# Patient Record
Sex: Female | Born: 1995 | Race: White | Hispanic: No | Marital: Married | Smoking: Never smoker
Health system: Southern US, Community
[De-identification: ages and names within clinical notes are randomized; demographics above are authoritative.]

## PROBLEM LIST (undated history)

## (undated) DIAGNOSIS — D649 Anemia, unspecified: Secondary | ICD-10-CM

## (undated) DIAGNOSIS — J45909 Unspecified asthma, uncomplicated: Secondary | ICD-10-CM

## (undated) DIAGNOSIS — C4491 Basal cell carcinoma of skin, unspecified: Secondary | ICD-10-CM

## (undated) HISTORY — PX: BILATERAL SALPINGECTOMY: SHX5743

## (undated) HISTORY — PX: WISDOM TOOTH EXTRACTION: SHX21

## (undated) HISTORY — DX: Basal cell carcinoma of skin, unspecified: C44.91

---
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
## 2018-01-14 ENCOUNTER — Emergency Department
Admission: EM | Admit: 2018-01-14 | Discharge: 2018-01-14 | Disposition: A | Discharge status: Home / Self Care | Payer: Commercial Managed Care - HMO | Attending: Student in an Organized Health Care Education/Training Program | Admitting: Student in an Organized Health Care Education/Training Program

## 2018-01-14 ENCOUNTER — Emergency Department: Payer: Commercial Managed Care - HMO

## 2018-01-14 ENCOUNTER — Encounter: Payer: Self-pay | Admitting: Emergency Medicine

## 2018-01-14 DIAGNOSIS — G510 Bell's palsy: Secondary | ICD-10-CM | POA: Insufficient documentation

## 2018-01-14 LAB — CBC WITH DIFFERENTIAL/PLATELET
Basophils Absolute: 0 10*3/uL (ref 0–0.1)
Basophils Relative: 0 %
EOS ABS: 0.1 10*3/uL (ref 0–0.7)
EOS PCT: 1 %
HCT: 38.3 % (ref 35.0–47.0)
Hemoglobin: 12.7 g/dL (ref 12.0–16.0)
Lymphocytes Relative: 31 %
Lymphs Abs: 2.6 10*3/uL (ref 1.0–3.6)
MCH: 28 pg (ref 26.0–34.0)
MCHC: 33.3 g/dL (ref 32.0–36.0)
MCV: 84.2 fL (ref 80.0–100.0)
MONO ABS: 0.6 10*3/uL (ref 0.2–0.9)
MONOS PCT: 7 %
Neutro Abs: 5.2 10*3/uL (ref 1.4–6.5)
Neutrophils Relative %: 61 %
PLATELETS: 307 10*3/uL (ref 150–440)
RBC: 4.55 MIL/uL (ref 3.80–5.20)
RDW: 12.5 % (ref 11.5–14.5)
WBC: 8.6 10*3/uL (ref 3.6–11.0)

## 2018-01-14 LAB — BASIC METABOLIC PANEL
Anion gap: 7 (ref 5–15)
BUN: 14 mg/dL (ref 6–20)
CALCIUM: 9 mg/dL (ref 8.9–10.3)
CO2: 26 mmol/L (ref 22–32)
CREATININE: 0.65 mg/dL (ref 0.44–1.00)
Chloride: 105 mmol/L (ref 101–111)
GFR calc Af Amer: >60 mL/min (ref >60)
Glucose, Bld: 81 mg/dL (ref 65–99)
Potassium: 3.6 mmol/L (ref 3.5–5.1)
SODIUM: 138 mmol/L (ref 135–145)

## 2018-01-14 LAB — POCT PREGNANCY, URINE: Preg Test, Ur: NEGATIVE

## 2018-01-14 MED ORDER — IOHEXOL 350 MG/ML SOLN
75.0000 mL | Freq: Once | INTRAVENOUS | Status: AC | PRN
  Administered 2018-01-14: 75 mL via INTRAVENOUS

## 2018-01-14 MED ORDER — VALACYCLOVIR HCL 500 MG PO TABS: 1000.0000 mg | ORAL_TABLET | Freq: Three times a day (TID) | ORAL | 0 refills | Status: AC

## 2018-01-14 MED ORDER — PREDNISONE 20 MG PO TABS
60.0000 mg | ORAL_TABLET | Freq: Once | ORAL | Status: AC
  Administered 2018-01-14: 60 mg via ORAL
  Filled 2018-01-14: qty 6

## 2018-01-14 MED ORDER — POLYMYXIN B-TRIMETHOPRIM 10000-0.1 UNIT/ML-% OP SOLN: 1.0000 [drp] | OPHTHALMIC | 0 refills | Status: DC

## 2018-01-14 MED ORDER — PREDNISONE 10 MG PO TABS: 10.0000 mg | ORAL_TABLET | Freq: Every day | ORAL | 0 refills | Status: DC

## 2018-01-14 NOTE — ED Notes (Signed)
Patient transported to CT 

## 2018-01-14 NOTE — Discharge Instructions (Signed)
Sure to keep right eye covered while sleeping to prevent abrasion or injury.  Follow-up with neurology.  Return to the ER should you develop any worsening symptoms or for any additional questions or concerns.

## 2018-01-14 NOTE — ED Triage Notes (Signed)
Patient presents to the ED with right facial and neck numbness since 6pm 2 days ago.  Patient's facial movements are uneven.  Patient cannot shut right eye as tightly as her left eye.  Patient denies any weakness in arm or leg.  Patient is having no difficulty speaking.  Alert and oriented x 4.

## 2018-01-14 NOTE — ED Provider Notes (Signed)
[facility] Emergency Department Provider Note    First MD Initiated Contact with Patient 01/14/18 1119     (approximate)  I have reviewed the triage vital signs and the nursing notes.   HISTORY  Chief Complaint Numbness (right side)    HPI Madison Morrow is a 22 y.o. female present to the ER with chief complaint of right sided facial tingling as well as weakness started roughly 1/2 days ago.  Says she is also having some right-sided neck pain.  States that she has numbness and tingling going down into her right neck and right shoulder.  States that she noticed this morning when she was switching mouthwash that she is having trouble keeping the water in her mouth.  Denies any other associated numbness or tingling.  Does have family history of stroke.  Has a family history of hypertension but she has no significant past medical history.  Denies any chance of being pregnant.    History reviewed. No pertinent past medical history. No family history on file. Past Surgical History:  Procedure Laterality Date  . BILATERAL SALPINGECTOMY     There are no active problems to display for this patient.     Prior to Admission medications   Medication Sig Start Date End Date Taking? Authorizing Provider  predniSONE (DELTASONE) 10 MG tablet Take 1 tablet (10 mg total) by mouth daily. Day 1-2: Take 50 mg  ( 5 pills) Day 3-4 : Take 40 mg (4pills) Day 5-6: Take 30 mg (3 pills) Day 7-8:  Take 20 mg (2 pills) Day 9:  Take 10mg  (1 pill) 01/14/18   Merlyn Lot, MD  trimethoprim-polymyxin b (POLYTRIM) ophthalmic solution Place 1 drop into the right eye every 4 (four) hours. 01/14/18   Merlyn Lot, MD  valACYclovir (VALTREX) 500 MG tablet Take 2 tablets (1,000 mg total) by mouth 3 (three) times daily for 7 days. 01/14/18 01/21/18  Merlyn Lot, MD    Allergies Amoxicillin and Penicillins    Social History Social History   Tobacco Use  . Smoking  status: Never Smoker  . Smokeless tobacco: Never Used  Substance Use Topics  . Alcohol use: Never    Frequency: Never  . Drug use: Not on file    Review of Systems Patient denies headaches, rhinorrhea, blurry vision, numbness, shortness of breath, chest pain, edema, cough, abdominal pain, nausea, vomiting, diarrhea, dysuria, fevers, rashes or hallucinations unless otherwise stated above in HPI. ____________________________________________   PHYSICAL EXAM:  VITAL SIGNS: Vitals:   01/14/18 1730 01/14/18 1800  BP: 129/84 117/75  Pulse: 82 (!) 56  Resp:  14  Temp:    SpO2: 100% (!) 82%    Constitutional: Alert and oriented.  Eyes: Conjunctivae are normal.  Head: Atraumatic. Nose: No congestion/rhinnorhea. Mouth/Throat: Mucous membranes are moist.   Neck: No stridor. Painless ROM.  Cardiovascular: Normal rate, regular rhythm. Grossly normal heart sounds.  Good peripheral circulation. Respiratory: Normal respiratory effort.  No retractions. Lungs CTAB. Gastrointestinal: Soft and nontender. No distention. No abdominal bruits. No CVA tenderness. Genitourinary:  Musculoskeletal: No lower extremity tenderness nor edema.  No joint effusions. Neurologic:  Normal speech and language.+ right facial droop.  Decreased sensation of right face.  Forehead is preserved on right.  EOMI.  SILT to BUE and BLE.  5/5 motor in BUE and BLE.  Normal FNF Skin:  Skin is warm, dry and intact. No rash noted. Psychiatric: Mood and affect are normal. Speech and behavior are normal.  ____________________________________________  LABS (all labs ordered are listed, but only abnormal results are displayed)  Results for orders placed or performed during the hospital encounter of 01/14/18 (from the past 24 hour(s))  CBC with Differential/Platelet     Status: None   Collection Time: 01/14/18 11:43 AM  Result Value Ref Range   WBC 8.6 3.6 - 11.0 K/uL   RBC 4.55 3.80 - 5.20 MIL/uL   Hemoglobin 12.7 12.0 -  16.0 g/dL   HCT 38.3 35.0 - 47.0 %   MCV 84.2 80.0 - 100.0 fL   MCH 28.0 26.0 - 34.0 pg   MCHC 33.3 32.0 - 36.0 g/dL   RDW 12.5 11.5 - 14.5 %   Platelets 307 150 - 440 K/uL   Neutrophils Relative % 61 %   Neutro Abs 5.2 1.4 - 6.5 K/uL   Lymphocytes Relative 31 %   Lymphs Abs 2.6 1.0 - 3.6 K/uL   Monocytes Relative 7 %   Monocytes Absolute 0.6 0.2 - 0.9 K/uL   Eosinophils Relative 1 %   Eosinophils Absolute 0.1 0 - 0.7 K/uL   Basophils Relative 0 %   Basophils Absolute 0.0 0 - 0.1 K/uL  Basic metabolic panel     Status: None   Collection Time: 01/14/18 11:43 AM  Result Value Ref Range   Sodium 138 135 - 145 mmol/L   Potassium 3.6 3.5 - 5.1 mmol/L   Chloride 105 101 - 111 mmol/L   CO2 26 22 - 32 mmol/L   Glucose, Bld 81 65 - 99 mg/dL   BUN 14 6 - 20 mg/dL   Creatinine, Ser 0.65 0.44 - 1.00 mg/dL   Calcium 9.0 8.9 - 10.3 mg/dL   GFR calc non Af Amer >60 >60 mL/min   GFR calc Af Amer >60 >60 mL/min   Anion gap 7 5 - 15  Pregnancy, urine POC     Status: None   Collection Time: 01/14/18 12:45 PM  Result Value Ref Range   Preg Test, Ur NEGATIVE NEGATIVE   ____________________________________________  EKG My review and personal interpretation at Time: 11:48   Indication: weakness  Rate: 70  Rhythm:   Axis: sinus Other: normal intervals, no stemi ____________________________________________  RADIOLOGY  I personally reviewed all radiographic images ordered to evaluate for the above acute complaints and reviewed radiology reports and findings.  These findings were personally discussed with the patient.  Please see medical record for radiology report.  ____________________________________________   PROCEDURES  Procedure(s) performed:  Procedures    Critical Care performed: o ____________________________________________   INITIAL IMPRESSION / ASSESSMENT AND PLAN / ED COURSE  Pertinent labs & imaging results that were available during my care of the patient were  reviewed by me and considered in my medical decision making (see chart for details).   DDX: bells palsy, vertebral dissection cva, mass, neuropathy, lymes, pregnancy  Madison Morrow is a 22 y.o. who presents to the ED with symptoms as described above.  Symptoms started over a day and a half ago seem to be steadily progressing.  Outside of the window for stroke.  Presentation most likely partial Bell's palsy given her age And lack of risk factors for CVA but given her neck pain will also order CTA to exclude dissection.  Blood work sent for the above differential.  Patient otherwise well-appearing and in no acute distress.  Clinical Course as of Jan 15 2032  Sun Jan 14, 2018  1438 Discussed abnormality of CTA with patient of recommended MRI for further  evaluation.  Patient agrees to   [PR]    Clinical Course User Index [PR] Merlyn Lot, MD   MRI shows no evidence of acute ischemia.  I discussed case with Dr. Irish Elders of neurology.  Based on the abnormal finding on CTA will start on prednisone as well as send off inflammatory markers.  Vasculitis remains on the differential but patient otherwise with minimal symptoms at this time which could be consistent with Bell's.  Patient will be given referral to outpatient neurology.  Patient demonstrates understanding is agreeable to plan.  As part of my medical decision making, I reviewed the following data within the [place] notes reviewed and incorporated, Labs reviewed, notes from prior ED visits.  ____________________________________________   FINAL CLINICAL IMPRESSION(S) / ED DIAGNOSES  Final diagnoses:  Bell's palsy      NEW MEDICATIONS STARTED DURING THIS VISIT:  Discharge Medication List as of 01/14/2018  5:48 PM    START taking these medications   Details  predniSONE (DELTASONE) 10 MG tablet Take 1 tablet (10 mg total) by mouth daily. Day 1-2: Take 50 mg  ( 5 pills) Day 3-4 : Take 40 mg  (4pills) Day 5-6: Take 30 mg (3 pills) Day 7-8:  Take 20 mg (2 pills) Day 9:  Take 10mg  (1 pill), Starting Sun 01/14/2018, Print    trimethoprim-polymyxin b (POLYTRIM) ophthalmic solution Place 1 drop into the right eye every 4 (four) hours., Starting Sun 01/14/2018, Print    valACYclovir (VALTREX) 500 MG tablet Take 2 tablets (1,000 mg total) by mouth 3 (three) times daily for 7 days., Starting Sun 01/14/2018, Until Sun 01/21/2018, Print         Note:  This document was prepared using Dragon voice recognition software and may include unintentional dictation errors.    Merlyn Lot, MD 01/14/18 2034

## 2018-01-14 NOTE — ED Notes (Signed)
Pt transported to MRI 

## 2018-01-14 NOTE — ED Notes (Signed)
First Nurse Note: Pt to ED via POV c/o right sided facial numbness x 2 days. No facial droop noted. No slurred speech.

## 2018-01-15 LAB — C-REACTIVE PROTEIN

## 2019-09-25 DIAGNOSIS — D239 Other benign neoplasm of skin, unspecified: Secondary | ICD-10-CM

## 2019-09-25 HISTORY — DX: Other benign neoplasm of skin, unspecified: D23.9

## 2019-09-30 NOTE — H&P (Signed)
Chief Complaint:   Madison Morrow is a 24 y.o. female presenting with Pre Op Consulting (sign consents)   History of Present Illness: Patient is an established patient who returns for a pre-operative visit. She will undergo a Total Laparoscopic Hysterectomy. Indications are her history of endometriosis, pelvic pain, and menorrhagia. She has failed OTC medications, she declines any hormonal treatment, and desires something permanent. She is s/p BS for sterilization and endometriosis pain. She is fully aware that without a uterus she will be unable to gestate a pregnancy, and that is not something she ever wants to do.  - She does not want an open incision if she can help it. She brought records for review today. Stage 1 endometriosis noted 4 yrs ago in the anterior and posterior cul de sac. If extensive, will abort procedure and send to [facility]. That offer given today as well.  Workup: Pap: 09/2018 neg/neg TVUS 12/2018: Uterus anteverted No IUD seen Complex thickened endometrium=13.54mm Fibroid seen: posterior=3cm Small amt of free fluid seen in PCDS Lt ovary appears wnl Rt complex ovarian cyst=2.1cm; septation=0.16cm   Pertinent Surgical Hx: -Bilateral salpingectomy, pt reports surgeon told her he found endometriosis in the back of the uterus and on the colon  Past Medical History:  has a past medical history of Bell's palsy, Endometriosis, and Seasonal asthma.  Past Surgical History:  has a past surgical history that includes Laparoscopic Salpingectomy (Bilateral, 2017). Family History: family history includes High blood pressure (Hypertension) in her father; Thyroid disease in her mother. Social History:  reports that she has never smoked. She has never used smokeless tobacco. She reports previous alcohol use. She reports that she does not use drugs. OB/GYN History:  OB History    Gravida  0   Para  0   Term  0   Preterm  0   AB  0   Living  0     SAB  0   TAB  0   Ectopic  0   Molar  0   Multiple  0   Live Births  0        Allergies: is allergic to amoxicillin and penicillin. Medications: Current Outpatient Medications:  .  albuterol 90 mcg/actuation inhaler, Inhale 2 inhalations into the lungs every 6 (six) hours as needed for Wheezing, Disp: 1 Inhaler, Rfl: 0 .  ferrous sulfate 325 (65 FE) MG tablet, Take 325 mg by mouth daily with breakfast, Disp: , Rfl:  .  meloxicam (MOBIC) 15 MG tablet, Take 1 tablet (15 mg total) by mouth once daily as needed for Pain For menstrual pain (Patient not taking: Reported on 09/17/2019  ), Disp: 30 tablet, Rfl: 0   Exam:   BP (!) 132/90   Ht 167.6 cm (5\' 6" )   Wt 81 kg (178 lb 9.6 oz)   LMP 09/13/2019 (Exact Date)   BMI 28.83 kg/m   General: Patient is well-groomed, well-nourished, appears stated age in no acute distress   HEENT: head is atraumatic and normocephalic, trachea is midline, neck is supple with no palpable nodules   CV: Regular rhythm and normal heart rate, no murmur   Pulm: Clear to auscultation throughout lung fields with no wheezing, crackles, or rhonchi. No increased work of breathing  Abdomen: soft , no mass, non-tender, no rebound tenderness, no hepatomegaly  Pelvic: deferred  Impression:   The primary encounter diagnosis was Pre-operative clearance. A diagnosis of History of endometriosis was also pertinent to this visit.  Plan:  1. Pre-Operative Visit,  Patient returns for a preoperative discussion regarding her plans to proceed with surgical treatment of her endometriosis, pelvic pain, and menorrhagia by total laparoscopic hysterectomy procedure. We will perform a cystoscopy to evaluate the urinary tract after the procedure.   The patient and I discussed the technical aspects of the procedure including the potential for risks and complications.  These include but are not limited to the risk of infection requiring post-operative antibiotics or further procedures.  We talked  about the risk of injury to adjacent organs including bladder, bowel, ureter, blood vessels or nerves.  We talked about the need to convert to an open incision.  We talked about the possible need for blood transfusion.  We talked about postop complications such as thromboembolic or cardiopulmonary complications.  All of her questions were answered.  Her preoperative exam was completed and the appropriate consents were signed. She is scheduled to undergo this procedure in the near future.  Specific Peri-operative Considerations:  - Consent: obtained today - Health Maintenance: None - Labs: CBC, CMP preoperatively - Studies: EKG, CXR preoperatively - Bowel Preparation: None required - Abx:  Gent/Clinda with PCN allergy - VTE ppx: SCDs perioperatively - Glucose Protocol: None - Beta-blockade: None  Patient is concerned about a large scar. I will plan for a Total Laparoscopic Hysterectomy but I have made patient aware that surgery can be unpredictable and risks to a TLH include possible conversion to an Open Abdominal Hysterectomy with a larger scar. She has requested that if for any reason there is a contraindication for laparoscopic approach, I stop surgery and we return to the option of an ablation even if it is not the recommended option. I have also offered to refer her to [place] at this time, and she would accept that option if unable to complete this surgery.  Diagnoses and all orders for this visit:  Pre-operative clearance  History of endometriosis   Return for Postop check.  ~~~~~~~~~~~~~~~~~~~~~~~~~~~~~~~~~~~~~~~~~~~~~~~~~~~~~~~~~~~~ This note is partially written by Priscella Mann, in the presence of and acting as the scribe of Dr. Benjaman Kindler, who has reviewed, edited and added to the note to reflect her best personal medical judgment.  This note was generated in part with voice recognition software and I apologize for any typographical errors that were not detected and  corrected.  Sherrie George, MD

## 2019-10-04 ENCOUNTER — Encounter: Payer: Self-pay | Admitting: *Deleted

## 2019-10-04 ENCOUNTER — Encounter
Admission: RE | Admit: 2019-10-04 | Discharge: 2019-10-04 | Disposition: A | Discharge status: Home / Self Care | Payer: Commercial Managed Care - PPO | Source: Ambulatory Visit | Attending: Obstetrics and Gynecology | Admitting: Obstetrics and Gynecology

## 2019-10-04 ENCOUNTER — Other Ambulatory Visit: Payer: Self-pay

## 2019-10-04 HISTORY — DX: Anemia, unspecified: D64.9

## 2019-10-04 HISTORY — DX: Unspecified asthma, uncomplicated: J45.909

## 2019-10-04 NOTE — Patient Instructions (Signed)
Your procedure is scheduled on: Mon 3/22 Report to Day Surgery. To find out your arrival time please call [phone] between 1PM - 3PM on Friday 3/19.  Remember: Instructions that are not followed completely may result in serious medical risk,  up to and including death, or upon the discretion of your surgeon and anesthesiologist your  surgery may need to be rescheduled.     _X__ 1. Do not eat food after midnight the night before your procedure.                 No gum chewing or hard candies. You may drink clear liquids up to 2 hours                 before you are scheduled to arrive for your surgery- DO not drink clear                 liquids within 2 hours of the start of your surgery.                 Clear Liquids include:  water, apple juice without pulp, clear Gatorade, G2 or                  Gatorade Zero (avoid Red/Purple/Blue), Black Coffee or Tea (Do not add                 anything to coffee or tea). __x___2.   Complete the carbohydrate drink provided to you, 2 hours before arrival.  __X__2.  On the morning of surgery brush your teeth with toothpaste and water, you                may rinse your mouth with mouthwash if you wish.  Do not swallow any toothpaste of mouthwash.     ___ 3.  No Alcohol for 24 hours before or after surgery.   ___ 4.  Do Not Smoke or use e-cigarettes For 24 Hours Prior to Your Surgery.                 Do not use any chewable tobacco products for at least 6 hours prior to                 surgery.  ____  5.  Bring all medications with you on the day of surgery if instructed.   _x___  6.  Notify your doctor if there is any change in your medical condition      (cold, fever, infections).     Do not wear jewelry, make-up, hairpins, clips or nail polish. Do not wear lotions, powders, or perfumes. You may wear deodorant. Do not shave 48 hours prior to surgery. Men may shave face and neck. Do not bring valuables to the hospital.     [facility] is not responsible for any belongings or valuables.  Contacts, dentures or bridgework may not be worn into surgery. Leave your suitcase in the car. After surgery it may be brought to your room. For patients admitted to the hospital, discharge time is determined by your treatment team.   Patients discharged the day of surgery will not be allowed to drive home.   Make arrangements for someone to be with you for the first 24 hours of your Same Day Discharge.    Please read over the following fact sheets that you were given:    _x___ Take these medicines the morning of surgery with A SIP OF WATER:  1. none  2.   3.   4.  5.  6.  ____ Fleet Enema (as directed)   __x__ Use CHG Soap (or wipes) as directed  ____ Use Benzoyl Peroxide Gel as instructed  __x__ Use inhalers on the day of surgery albuterol (VENTOLIN HFA) 108 (90 Base) MCG/ACT inhaler  And bring with yout  ____ Stop metformin 2 days prior to surgery    ____ Take 1/2 of usual insulin dose the night before surgery. No insulin the morning          of surgery.   ____ Stop Coumadin/Plavix/aspirin    ____ Stop Anti-inflammatories    __x__ Stop supplements until after surgery.  ascorbic acid (VITAMIN C) 500 MG tablet  ____ Bring C-Pap to the hospital.

## 2019-10-07 ENCOUNTER — Other Ambulatory Visit: Payer: Self-pay

## 2019-10-09 ENCOUNTER — Encounter: Admission: RE | Admit: 2019-10-09 | Payer: Commercial Managed Care - PPO | Source: Ambulatory Visit

## 2019-10-10 ENCOUNTER — Other Ambulatory Visit
Admission: RE | Admit: 2019-10-10 | Discharge: 2019-10-10 | Disposition: A | Discharge status: Home / Self Care | Payer: Commercial Managed Care - PPO | Source: Ambulatory Visit | Attending: Obstetrics and Gynecology | Admitting: Obstetrics and Gynecology

## 2019-10-10 ENCOUNTER — Other Ambulatory Visit: Payer: Self-pay

## 2019-10-10 DIAGNOSIS — Z01812 Encounter for preprocedural laboratory examination: Secondary | ICD-10-CM | POA: Diagnosis not present

## 2019-10-10 DIAGNOSIS — Z20822 Contact with and (suspected) exposure to covid-19: Secondary | ICD-10-CM | POA: Diagnosis not present

## 2019-10-10 LAB — SARS CORONAVIRUS 2 (TAT 6-24 HRS): SARS Coronavirus 2: NEGATIVE

## 2019-10-10 LAB — TYPE AND SCREEN
ABO/RH(D): O POS
Antibody Screen: NEGATIVE

## 2019-10-14 ENCOUNTER — Encounter: Payer: Self-pay | Admitting: Obstetrics and Gynecology

## 2019-10-14 ENCOUNTER — Ambulatory Visit: Payer: Commercial Managed Care - PPO | Admitting: Registered Nurse

## 2019-10-14 ENCOUNTER — Encounter
Admission: RE | Disposition: A | Discharge status: Home / Self Care | Payer: Self-pay | Source: Home / Self Care | Attending: Obstetrics and Gynecology

## 2019-10-14 ENCOUNTER — Other Ambulatory Visit: Payer: Self-pay

## 2019-10-14 ENCOUNTER — Ambulatory Visit
Admission: RE | Admit: 2019-10-14 | Discharge: 2019-10-14 | Disposition: A | Discharge status: Home / Self Care | Payer: Commercial Managed Care - PPO | Attending: Obstetrics and Gynecology | Admitting: Obstetrics and Gynecology

## 2019-10-14 DIAGNOSIS — D259 Leiomyoma of uterus, unspecified: Secondary | ICD-10-CM | POA: Diagnosis not present

## 2019-10-14 DIAGNOSIS — R102 Pelvic and perineal pain: Secondary | ICD-10-CM | POA: Diagnosis present

## 2019-10-14 DIAGNOSIS — Z880 Allergy status to penicillin: Secondary | ICD-10-CM | POA: Insufficient documentation

## 2019-10-14 DIAGNOSIS — N870 Mild cervical dysplasia: Secondary | ICD-10-CM | POA: Insufficient documentation

## 2019-10-14 DIAGNOSIS — N803 Endometriosis of pelvic peritoneum: Secondary | ICD-10-CM | POA: Diagnosis not present

## 2019-10-14 DIAGNOSIS — Z79899 Other long term (current) drug therapy: Secondary | ICD-10-CM | POA: Insufficient documentation

## 2019-10-14 HISTORY — PX: LAPAROSCOPIC HYSTERECTOMY: SHX1926

## 2019-10-14 LAB — CBC WITH DIFFERENTIAL/PLATELET
Abs Immature Granulocytes: 0.04 10*3/uL (ref 0.00–0.07)
Basophils Absolute: 0 10*3/uL (ref 0.0–0.1)
Basophils Relative: 1 %
Eosinophils Absolute: 0.1 10*3/uL (ref 0.0–0.5)
Eosinophils Relative: 1 %
HCT: 41.1 % (ref 36.0–46.0)
Hemoglobin: 13.2 g/dL (ref 12.0–15.0)
Immature Granulocytes: 1 %
Lymphocytes Relative: 26 %
Lymphs Abs: 2.1 10*3/uL (ref 0.7–4.0)
MCH: 27.6 pg (ref 26.0–34.0)
MCHC: 32.1 g/dL (ref 30.0–36.0)
MCV: 85.8 fL (ref 80.0–100.0)
Monocytes Absolute: 0.5 10*3/uL (ref 0.1–1.0)
Monocytes Relative: 7 %
Neutro Abs: 5.2 10*3/uL (ref 1.7–7.7)
Neutrophils Relative %: 64 %
Platelets: 310 10*3/uL (ref 150–400)
RBC: 4.79 MIL/uL (ref 3.87–5.11)
RDW: 12.2 % (ref 11.5–15.5)
WBC: 7.9 10*3/uL (ref 4.0–10.5)
nRBC: 0 % (ref 0.0–0.2)

## 2019-10-14 LAB — COMPREHENSIVE METABOLIC PANEL
ALT: 12 U/L (ref 0–44)
AST: 17 U/L (ref 15–41)
Albumin: 4.6 g/dL (ref 3.5–5.0)
Alkaline Phosphatase: 62 U/L (ref 38–126)
Anion gap: 9 (ref 5–15)
BUN: 11 mg/dL (ref 6–20)
CO2: 23 mmol/L (ref 22–32)
Calcium: 9.2 mg/dL (ref 8.9–10.3)
Chloride: 107 mmol/L (ref 98–111)
Creatinine, Ser: 0.64 mg/dL (ref 0.44–1.00)
GFR calc Af Amer: >60 mL/min (ref >60)
GFR calc non Af Amer: >60 mL/min (ref >60)
Glucose, Bld: 73 mg/dL (ref 70–99)
Potassium: 3.8 mmol/L (ref 3.5–5.1)
Sodium: 139 mmol/L (ref 135–145)
Total Bilirubin: 0.6 mg/dL (ref 0.3–1.2)
Total Protein: 7.9 g/dL (ref 6.5–8.1)

## 2019-10-14 LAB — ABO/RH: ABO/RH(D): O POS

## 2019-10-14 LAB — POCT PREGNANCY, URINE: Preg Test, Ur: NEGATIVE

## 2019-10-14 SURGERY — HYSTERECTOMY, TOTAL, LAPAROSCOPIC
Anesthesia: General

## 2019-10-14 MED ORDER — SODIUM CHLORIDE FLUSH 0.9 % IV SOLN
INTRAVENOUS | Status: AC
  Filled 2019-10-14: qty 10

## 2019-10-14 MED ORDER — ACETAMINOPHEN 500 MG PO TABS: 1000.0000 mg | ORAL_TABLET | ORAL | Status: AC

## 2019-10-14 MED ORDER — FAMOTIDINE 20 MG PO TABS: 20.0000 mg | ORAL_TABLET | Freq: Once | ORAL | Status: AC

## 2019-10-14 MED ORDER — ONDANSETRON HCL 4 MG/2ML IJ SOLN
INTRAMUSCULAR | Status: DC | PRN
  Administered 2019-10-14: 4 mg via INTRAVENOUS

## 2019-10-14 MED ORDER — BUPIVACAINE HCL 0.5 % IJ SOLN
INTRAMUSCULAR | Status: DC | PRN
  Administered 2019-10-14: 12 mL

## 2019-10-14 MED ORDER — MEPERIDINE HCL 50 MG/ML IJ SOLN
6.2500 mg | Freq: Once | INTRAMUSCULAR | Status: AC
  Administered 2019-10-14: 6.25 mg via INTRAVENOUS

## 2019-10-14 MED ORDER — IBUPROFEN 800 MG PO TABS: 800.0000 mg | ORAL_TABLET | Freq: Three times a day (TID) | ORAL | 1 refills | Status: AC

## 2019-10-14 MED ORDER — CLINDAMYCIN PHOSPHATE 900 MG/50ML IV SOLN
900.0000 mg | INTRAVENOUS | Status: AC
  Administered 2019-10-14: 13:00:00 900 mg via INTRAVENOUS

## 2019-10-14 MED ORDER — PROPOFOL 10 MG/ML IV BOLUS
INTRAVENOUS | Status: AC
  Filled 2019-10-14: qty 20

## 2019-10-14 MED ORDER — SENNA 8.6 MG PO TABS: 1.0000 | ORAL_TABLET | Freq: Every day | ORAL | 0 refills | Status: AC | PRN

## 2019-10-14 MED ORDER — MIDAZOLAM HCL 2 MG/2ML IJ SOLN
INTRAMUSCULAR | Status: AC
  Filled 2019-10-14: qty 2

## 2019-10-14 MED ORDER — LIDOCAINE HCL (CARDIAC) PF 100 MG/5ML IV SOSY
PREFILLED_SYRINGE | INTRAVENOUS | Status: DC | PRN
  Administered 2019-10-14: 60 mg via INTRAVENOUS
  Administered 2019-10-14: 40 mg via INTRAVENOUS

## 2019-10-14 MED ORDER — EPHEDRINE 5 MG/ML INJ
INTRAVENOUS | Status: AC
  Filled 2019-10-14: qty 10

## 2019-10-14 MED ORDER — FENTANYL CITRATE (PF) 100 MCG/2ML IJ SOLN
25.0000 ug | INTRAMUSCULAR | Status: DC | PRN
  Administered 2019-10-14 (×3): 25 ug via INTRAVENOUS

## 2019-10-14 MED ORDER — LIDOCAINE HCL (PF) 2 % IJ SOLN
INTRAMUSCULAR | Status: AC
  Filled 2019-10-14: qty 5

## 2019-10-14 MED ORDER — GABAPENTIN 300 MG PO CAPS: 300.0000 mg | ORAL_CAPSULE | ORAL | Status: AC

## 2019-10-14 MED ORDER — ACETAMINOPHEN 500 MG PO TABS
ORAL_TABLET | ORAL | Status: AC
  Administered 2019-10-14: 1000 mg via ORAL
  Filled 2019-10-14: qty 2

## 2019-10-14 MED ORDER — ONDANSETRON HCL 4 MG/2ML IJ SOLN: 4.0000 mg | Freq: Once | INTRAMUSCULAR | Status: DC | PRN

## 2019-10-14 MED ORDER — GENTAMICIN SULFATE 40 MG/ML IJ SOLN
5.0000 mg/kg | INTRAVENOUS | Status: AC
  Administered 2019-10-14: 400 mg via INTRAVENOUS
  Filled 2019-10-14: qty 10

## 2019-10-14 MED ORDER — DEXAMETHASONE SODIUM PHOSPHATE 10 MG/ML IJ SOLN
INTRAMUSCULAR | Status: AC
  Filled 2019-10-14: qty 1

## 2019-10-14 MED ORDER — DEXAMETHASONE SODIUM PHOSPHATE 10 MG/ML IJ SOLN
INTRAMUSCULAR | Status: DC | PRN
  Administered 2019-10-14: 8 mg via INTRAVENOUS

## 2019-10-14 MED ORDER — IBUPROFEN 800 MG PO TABS
ORAL_TABLET | ORAL | Status: AC
  Administered 2019-10-14: 800 mg
  Filled 2019-10-14: qty 1

## 2019-10-14 MED ORDER — ONDANSETRON HCL 4 MG/2ML IJ SOLN
INTRAMUSCULAR | Status: AC
  Filled 2019-10-14: qty 2

## 2019-10-14 MED ORDER — OXYCODONE HCL 5 MG PO TABS
ORAL_TABLET | ORAL | Status: AC
  Administered 2019-10-14: 5 mg via ORAL
  Filled 2019-10-14: qty 1

## 2019-10-14 MED ORDER — DEXMEDETOMIDINE HCL 200 MCG/2ML IV SOLN
INTRAVENOUS | Status: DC | PRN
  Administered 2019-10-14 (×3): 8 ug via INTRAVENOUS

## 2019-10-14 MED ORDER — ROCURONIUM BROMIDE 100 MG/10ML IV SOLN
INTRAVENOUS | Status: DC | PRN
  Administered 2019-10-14: 10 mg via INTRAVENOUS
  Administered 2019-10-14: 50 mg via INTRAVENOUS

## 2019-10-14 MED ORDER — PROPOFOL 10 MG/ML IV BOLUS
INTRAVENOUS | Status: DC | PRN
  Administered 2019-10-14: 160 mg via INTRAVENOUS
  Administered 2019-10-14: 20 mg via INTRAVENOUS

## 2019-10-14 MED ORDER — DEXMEDETOMIDINE HCL IN NACL 80 MCG/20ML IV SOLN
INTRAVENOUS | Status: AC
  Filled 2019-10-14: qty 20

## 2019-10-14 MED ORDER — GABAPENTIN 300 MG PO CAPS
ORAL_CAPSULE | ORAL | Status: AC
  Administered 2019-10-14: 300 mg via ORAL
  Filled 2019-10-14: qty 1

## 2019-10-14 MED ORDER — CELECOXIB 200 MG PO CAPS
ORAL_CAPSULE | ORAL | Status: AC
  Administered 2019-10-14: 400 mg via ORAL
  Filled 2019-10-14: qty 2

## 2019-10-14 MED ORDER — FENTANYL CITRATE (PF) 100 MCG/2ML IJ SOLN
INTRAMUSCULAR | Status: DC | PRN
  Administered 2019-10-14: 25 ug via INTRAVENOUS
  Administered 2019-10-14: 75 ug via INTRAVENOUS

## 2019-10-14 MED ORDER — MEPERIDINE HCL 50 MG/ML IJ SOLN
INTRAMUSCULAR | Status: AC
  Filled 2019-10-14: qty 1

## 2019-10-14 MED ORDER — KETAMINE HCL 10 MG/ML IJ SOLN
INTRAMUSCULAR | Status: DC | PRN
  Administered 2019-10-14: 10 mg via INTRAVENOUS
  Administered 2019-10-14: 30 mg via INTRAVENOUS

## 2019-10-14 MED ORDER — SUGAMMADEX SODIUM 200 MG/2ML IV SOLN
INTRAVENOUS | Status: DC | PRN
  Administered 2019-10-14: 200 mg via INTRAVENOUS

## 2019-10-14 MED ORDER — FENTANYL CITRATE (PF) 100 MCG/2ML IJ SOLN
INTRAMUSCULAR | Status: AC
  Filled 2019-10-14: qty 2

## 2019-10-14 MED ORDER — CELECOXIB 200 MG PO CAPS: 400.0000 mg | ORAL_CAPSULE | ORAL | Status: AC

## 2019-10-14 MED ORDER — FENTANYL CITRATE (PF) 100 MCG/2ML IJ SOLN
INTRAMUSCULAR | Status: AC
  Administered 2019-10-14: 25 ug via INTRAVENOUS
  Filled 2019-10-14: qty 2

## 2019-10-14 MED ORDER — FAMOTIDINE 20 MG PO TABS
ORAL_TABLET | ORAL | Status: AC
  Administered 2019-10-14: 20 mg via ORAL
  Filled 2019-10-14: qty 1

## 2019-10-14 MED ORDER — DOCUSATE SODIUM 100 MG PO CAPS: 100.0000 mg | ORAL_CAPSULE | Freq: Two times a day (BID) | ORAL | 0 refills | Status: AC

## 2019-10-14 MED ORDER — IBUPROFEN 800 MG PO TABS: 800.0000 mg | ORAL_TABLET | Freq: Three times a day (TID) | ORAL | Status: DC

## 2019-10-14 MED ORDER — CLINDAMYCIN PHOSPHATE 900 MG/50ML IV SOLN
INTRAVENOUS | Status: AC
  Filled 2019-10-14: qty 50

## 2019-10-14 MED ORDER — MIDAZOLAM HCL 2 MG/2ML IJ SOLN
INTRAMUSCULAR | Status: DC | PRN
  Administered 2019-10-14: 1 mg via INTRAVENOUS
  Administered 2019-10-14: 2 mg via INTRAVENOUS

## 2019-10-14 MED ORDER — BUPIVACAINE HCL (PF) 0.5 % IJ SOLN
INTRAMUSCULAR | Status: AC
  Filled 2019-10-14: qty 30

## 2019-10-14 MED ORDER — OXYCODONE HCL 5 MG PO TABS: 5.0000 mg | ORAL_TABLET | ORAL | 0 refills | Status: DC | PRN

## 2019-10-14 MED ORDER — ROCURONIUM BROMIDE 10 MG/ML (PF) SYRINGE
PREFILLED_SYRINGE | INTRAVENOUS | Status: AC
  Filled 2019-10-14: qty 10

## 2019-10-14 MED ORDER — EPHEDRINE SULFATE 50 MG/ML IJ SOLN
INTRAMUSCULAR | Status: DC | PRN
  Administered 2019-10-14: 10 mg via INTRAVENOUS

## 2019-10-14 MED ORDER — LACTATED RINGERS IV SOLN: INTRAVENOUS | Status: DC

## 2019-10-14 MED ORDER — GABAPENTIN 800 MG PO TABS: 800.0000 mg | ORAL_TABLET | Freq: Every day | ORAL | 0 refills | Status: DC

## 2019-10-14 MED ORDER — ACETAMINOPHEN 500 MG PO TABS: 1000.0000 mg | ORAL_TABLET | Freq: Four times a day (QID) | ORAL | 0 refills | Status: AC

## 2019-10-14 MED ORDER — OXYCODONE HCL 5 MG PO TABS: 5.0000 mg | ORAL_TABLET | Freq: Once | ORAL | Status: AC | PRN

## 2019-10-14 SURGICAL SUPPLY — 55 items
BAG URINE DRAIN 2000ML AR STRL (UROLOGICAL SUPPLIES) ×3 IMPLANT
BLADE SURG SZ11 CARB STEEL (BLADE) ×3 IMPLANT
CATH FOLEY 2WAY  5CC 16FR (CATHETERS) ×2
CATH URTH 16FR FL 2W BLN LF (CATHETERS) ×1 IMPLANT
CHLORAPREP W/TINT 26 (MISCELLANEOUS) ×3 IMPLANT
CORD MONOPOLAR M/FML 12FT (MISCELLANEOUS) ×3 IMPLANT
COUNTER NEEDLE 20/40 LG (NEEDLE) IMPLANT
COVER LIGHT HANDLE STERIS (MISCELLANEOUS) ×6 IMPLANT
COVER WAND RF STERILE (DRAPES) ×3 IMPLANT
DERMABOND ADVANCED (GAUZE/BANDAGES/DRESSINGS) ×2
DERMABOND ADVANCED .7 DNX12 (GAUZE/BANDAGES/DRESSINGS) ×1 IMPLANT
DEVICE SUTURE ENDOST 10MM (ENDOMECHANICALS) ×3 IMPLANT
DRAPE GENERAL ENDO 106X123.5 (DRAPES) ×3 IMPLANT
DRAPE STERI POUCH LG 24X46 STR (DRAPES) ×3 IMPLANT
DRSG TEGADERM 2-3/8X2-3/4 SM (GAUZE/BANDAGES/DRESSINGS) IMPLANT
GLOVE BIO SURGEON STRL SZ7 (GLOVE) ×9 IMPLANT
GLOVE INDICATOR 7.5 STRL GRN (GLOVE) ×3 IMPLANT
GOWN STRL REUS W/ TWL LRG LVL3 (GOWN DISPOSABLE) ×2 IMPLANT
GOWN STRL REUS W/ TWL XL LVL3 (GOWN DISPOSABLE) ×1 IMPLANT
GOWN STRL REUS W/TWL LRG LVL3 (GOWN DISPOSABLE) ×4
GOWN STRL REUS W/TWL XL LVL3 (GOWN DISPOSABLE) ×2
GRASPER SUT TROCAR 14GX15 (MISCELLANEOUS) ×3 IMPLANT
IRRIGATION STRYKERFLOW (MISCELLANEOUS) ×1 IMPLANT
IRRIGATOR STRYKERFLOW (MISCELLANEOUS) ×3
KIT PINK PAD W/HEAD ARE REST (MISCELLANEOUS) ×3
KIT PINK PAD W/HEAD ARM REST (MISCELLANEOUS) ×1 IMPLANT
KIT TURNOVER CYSTO (KITS) ×3 IMPLANT
L-HOOK LAP DISP 36CM (ELECTROSURGICAL) ×3
LABEL OR SOLS (LABEL) IMPLANT
LHOOK LAP DISP 36CM (ELECTROSURGICAL) ×1 IMPLANT
LIGASURE VESSEL 5MM BLUNT TIP (ELECTROSURGICAL) ×3 IMPLANT
MANIPULATOR VCARE LG CRV RETR (MISCELLANEOUS) IMPLANT
MANIPULATOR VCARE SML CRV RETR (MISCELLANEOUS) ×3 IMPLANT
MANIPULATOR VCARE STD CRV RETR (MISCELLANEOUS) IMPLANT
NS IRRIG 500ML POUR BTL (IV SOLUTION) ×3 IMPLANT
OCCLUDER COLPOPNEUMO (BALLOONS) ×3 IMPLANT
PACK GYN LAPAROSCOPIC (MISCELLANEOUS) ×3 IMPLANT
PAD OB MATERNITY 4.3X12.25 (PERSONAL CARE ITEMS) ×3 IMPLANT
PAD PREP 24X41 OB/GYN DISP (PERSONAL CARE ITEMS) ×3 IMPLANT
PENCIL ELECTRO HAND CTR (MISCELLANEOUS) ×3 IMPLANT
SCISSORS METZENBAUM CVD 33 (INSTRUMENTS) IMPLANT
SET CYSTO W/LG BORE CLAMP LF (SET/KITS/TRAYS/PACK) IMPLANT
SLEEVE ENDOPATH XCEL 5M (ENDOMECHANICALS) ×3 IMPLANT
SPONGE GAUZE 2X2 8PLY STER LF (GAUZE/BANDAGES/DRESSINGS) ×2
SPONGE GAUZE 2X2 8PLY STRL LF (GAUZE/BANDAGES/DRESSINGS) ×4 IMPLANT
SUT ENDO VLOC 180-0-8IN (SUTURE) ×3 IMPLANT
SUT MNCRL 4-0 (SUTURE) ×2
SUT MNCRL 4-0 27XMFL (SUTURE) ×1
SUT VIC AB 0 CT1 36 (SUTURE) ×6 IMPLANT
SUTURE MNCRL 4-0 27XMF (SUTURE) ×1 IMPLANT
SYR 10ML LL (SYRINGE) ×3 IMPLANT
SYR 50ML LL SCALE MARK (SYRINGE) ×3 IMPLANT
TROCAR ENDO BLADELESS 11MM (ENDOMECHANICALS) ×3 IMPLANT
TROCAR XCEL NON-BLD 5MMX100MML (ENDOMECHANICALS) ×3 IMPLANT
TUBING EVAC SMOKE HEATED PNEUM (TUBING) ×3 IMPLANT

## 2019-10-14 NOTE — Discharge Instructions (Addendum)
Discharge instructions after   total laparoscopic hysterectomy   For the next three days, take ibuprofen and acetaminophen on a schedule, every 8 hours. You can take them together or you can intersperse them, and take one every four hours. I also gave you gabapentin for nighttime, to help you sleep and also to control pain. Take gabapentin medicines at night for at least the next 3 nights. You also have a narcotic, oxycodone, to take as needed if the above medicines don't help.  Postop constipation is a major cause of pain. Stay well hydrated, walk as you tolerate, and take over the counter senna as well as stool softeners if you need them.    Signs and Symptoms to Report Call our office at [phone] if you have any of the following.  . Fever over 100.4 degrees or higher . Severe stomach pain not relieved with pain medications . Bright red bleeding that's heavier than a period that does not slow with rest . To go the bathroom a lot (frequency), you can't hold your urine (urgency), or it hurts when you empty your bladder (urinate) . Chest pain . Shortness of breath . Pain in the calves of your legs . Severe nausea and vomiting not relieved with anti-nausea medications . Signs of infection around your wounds, such as redness, hot to touch, swelling, green/yellow drainage (like pus), bad smelling discharge . Any concerns  What You Can Expect after Surgery . You may see some pink tinged, bloody fluid and bruising around the wound. This is normal. . You may notice shoulder and neck pain. This is caused by the gas used during surgery to expand your abdomen so your surgeon could get to the uterus easier. . You may have a sore throat because of the tube in your mouth during general anesthesia. This will go away in 2 to 3 days. . You may have some stomach cramps. . You may notice spotting on your panties. . You may have pain around the incision sites.   Activities after Your  Discharge Follow these guidelines to help speed your recovery at home: . Do the coughing and deep breathing as you did in the hospital for 2 weeks. Use the small blue breathing device, called the incentive spirometer for 2 weeks. . Don't drive if you are in pain or taking narcotic pain medicine. You may drive when you can safely slam on the brakes, turn the wheel forcefully, and rotate your torso comfortably. This is typically 1-2 weeks. Practice in a parking lot or side street prior to attempting to drive regularly.  . Ask others to help with household chores for 4 weeks. . Do not lift anything heavier that 10 pounds for 4-6 weeks. This includes pets, children, and groceries. . Don't do strenuous activities, exercises, or sports like vacuuming, tennis, squash, etc. until your doctor says it is safe to do so. ---Maintain pelvic rest for 8 weeks. This means nothing in the vagina or rectum at all (no douching, tampons, intercourse) for 8 weeks.  . Walk as you feel able. Rest often since it may take two or three weeks for your energy level to return to normal.  . You may climb stairs . Avoid constipation:   -Eat fruits, vegetables, and whole grains. Eat small meals as your appetite will take time to return to normal.   -Drink 6 to 8 glasses of water each day unless your doctor has told you to limit your fluids.   -Use a laxative   or stool softener as needed if constipation becomes a problem. You may take Miralax, metamucil, Citrucil, Colace, Senekot, FiberCon, etc. If this does not relieve the constipation, try two tablespoons of Milk Of Magnesia every 8 hours until your bowels move.  . You may shower. Gently wash the wounds with a mild soap and water. Pat dry. . Do not get in a hot tub, swimming pool, etc. for 6 weeks. . Do not use lotions, oils, powders on the wounds. . Do not douche, use tampons, or have sex until your doctor says it is okay. . Take your pain medicine when you need it. The medicine  may not work as well if the pain is bad.  Take the medicines you were taking before surgery. Other medications you will need are pain medications and possibly constipation and nausea medications (Zofran).    Here is a helpful article from the website [employer], regarding constipation  Here are reasons why constipation occurs after surgery: 1) During the operation and in the recovery room, most people are given opioid pain medication, primarily through an IV, to treat moderate or severe pain. Intravenous opioids include morphine, Dilaudid and fentanyl. After surgery, patients are often prescribed opioid pain medication to take by mouth at home, including codeine, Vicodin, Norco, and Percocet. All of these medications cause constipation by slowing down the movement of your intestine. 2) Changes in your diet before surgery can be another culprit. It is common to get specific instructions to change how you normally eat or drink before your surgery, like only having liquids the day before or not having anything to eat or drink after midnight the night before surgery. For this reason, temporary dehydration may occur. This, along with not eating or only having liquids, means that you are getting less fiber than usual. Both these factors contribute to constipation. 3) Changes in your diet after surgery can also contribute to the problem. Although many people don't have dietary restrictions after operations, being under anesthesia can make you lose your appetite for several hours and maybe even days. Some people can even have nausea or vomiting. Not eating or drinking normally means that you are not getting enough fiber and you can get dehydrated, both leading to constipation. 4) Lying in a bed more than usual--which happens before, during and after surgery--combined with the medications and diet changes, all work together to slow down your colon and make your poop turn to rock.  No one likes to be  constipated.  Let's face it, it's not a pleasant feeling when you don't poop for days, then strain on the toilet to finally pass something large enough to cause damage. An ounce of prevention is worth a pound of cure, so: 1. Assume you will be constipated. 2. Plan and prepare accordingly. Post-surgery is one of those unique situations where the temporary use of laxatives can make a world of difference. Always consult with your doctor, and recognize that if you wait several days after surgery to take a laxative, the constipation might be too severe for these over-the-counter options. It is always important to discuss all medications you plan on taking with your doctor. Ask your doctor if you can start the laxative immediately after surgery. *  Here are go-to post-surgery laxatives: Senna: Senna is an herb that acts as a "stimulant laxative," meaning it increases the activity of the intestine to cause you to have a bowel movement. It comes in many forms, but senna pills are easy to take and  are sold over the counter at almost all pharmacies. Since opioid pain medications slow down the activity of the intestine, it makes sense to take a medication to help reverse that side effect. Long-term use of a stimulant laxative is not a good idea since it can make your colon "lazy" and not function properly; however, temporary use immediately after surgery is acceptable. In general, if you are able to eat a normal diet, taking senna soon after surgery works the best. Senna usually works within hours to produce a bowel movement, but this is less predictable when you are taking different medications after surgery. Try not to wait several days to start taking senna, as often it is too late by then. Just like with all medications or supplements, check with your doctor before starting new treatment.   Magnesium: Magnesium is an important mineral that our body needs. We get magnesium from some foods that we eat, especially  foods that are high in fiber such as broccoli, almonds and whole grains. There are also magnesium-based medications used to treat constipation including milk of magnesia (magnesium hydroxide), magnesium citrate and magnesium oxide. They work by drawing water into the intestine, putting it into the class of "osmotic" laxatives. Magnesium products in low doses appear to be safe, but if taken in very large doses, can lead to problems such as irregular heartbeat, low blood pressure and even death. It can also affect other medications you might be taking, therefore it is important to discuss using magnesium with your physician and pharmacist before initiating therapy. Most over-the-counter magnesium laxatives work very well to help with the constipation related to surgery, but sometimes they work too well and lead to diarrhea. Make sure you are somewhere with easy access to a bathroom, just in case.   Bisacodyl: Bisacodyl (generic name) is sold under brand names such as Dulcolax. Much like senna, it is a "stimulant laxative," meaning it makes your intestines move more quickly to push out the stool. This is another good choice to start taking as soon as your doctor says you can take a laxative after surgery. It comes in pill form and as a suppository, which is a good choice for people who cannot or are not allowed to swallow pills. Studies have shown that it works as a laxative, but like most of these medications, you should use this on a short-term basis only.   Enema: Enemas strike fear in many people, but FEAR NOT! It's nowhere near as big a deal as you may think. An enema is just a way to get some liquid into your rectum by placing a specially designed device through your anus. If you have never done one, it might seem like a painful, unpleasant, uncomfortable, complicated and lengthy procedure. But in reality, it's simple, takes just a few seconds and is highly effective. The small ready-made bottles you buy at  the pharmacy are much easier than the hose/large rubber container type. Those recommended positions illustrated in some instructions are generally not necessary to place the enema. It's very similar to the insertion of a tampon, requiring a slight squat. Some extra lubrication on the enema's tip (or on your anus) will make it a breeze. In certain cases, there is no substitute for a good enema. For example, if someone has not pooped for a few days, the beginning of the poop waiting to come out can become rock hard. Passing that hard stool can lead to much pain and problems like anal fissures. Inserting  a little liquid to break up the rock-hard stool will help make its passage much easier. Enemas come with different liquids. Most come with saline, but there are also mineral oil options. You can also use warm water in the reusable enema containers. They all work. But since saline can sometimes be irritating, so try a mineral oil or water enema instead.  Here are commonly recommended constipation medications that do not work well for post-surgery constipation: Docusate: Docusate (generic name) most commonly referred to as Colace (brand name) is not really a laxative, but is classified as a stool softener. Although this medication is commonly prescribed, it is not recommended for several reasons: 1) there is no good medical evidence that it works 2) even if it has an effect, which is very questionable, it is minimal and cannot combat the intestinal slowing caused by the opioid medications. Skip docusate to save money and space in your pillbox for something more effective.  PEG: Miralax (brand name) is basically a chemical called polyethylene glycol (PEG) and it has gained tremendous popularity as a laxative. This product is an "osmotic laxative" meaning it works by pulling water into the stool, making it softer. This is very similar to the action of natural fiber in foods and supplements. Therefore, the effect seen  by this medication is not immediate, causing a bowel movement in a day or more. Is this medication strong enough to battle the constipation related to having an operation? Maybe for some people not prone to constipation. But for most people, other laxatives are better to prevent constipation after surgery.  OXYCODONE 5mg  taken March 22 at 3:25pm. Next dose if needed no sooner than 7:25pm    AMBULATORY SURGERY  DISCHARGE INSTRUCTIONS   1) The drugs that you were given will stay in your system until tomorrow so for the next 24 hours you should not:  A) Drive an automobile B) Make any legal decisions C) Drink any alcoholic beverage   2) You may resume regular meals tomorrow.  Today it is better to start with liquids and gradually work up to solid foods.  You may eat anything you prefer, but it is better to start with liquids, then soup and crackers, and gradually work up to solid foods.   3) Please notify your doctor immediately if you have any unusual bleeding, trouble breathing, redness and pain at the surgery site, drainage, fever, or pain not relieved by medication.    4) Additional Instructions:        Please contact your physician with any problems or Same Day Surgery at [phone], Monday through Friday 6 am to 4 pm, or [place] [facility] number at [phone].

## 2019-10-14 NOTE — Op Note (Signed)
Madison Morrow PROCEDURE DATE: 10/14/2019  PREOPERATIVE DIAGNOSIS: Chronic pelvic pain, endometriosis, menorrhagia POSTOPERATIVE DIAGNOSIS: The same PROCEDURE:  HYSTERECTOMY TOTAL LAPAROSCOPIC, ENDOMETRIOSIS EXCISION: , lysis of adhesions  SURGEON:  Dr. Benjaman Kindler ASSISTANT: Dr. Laverta Baltimore  PA-S: Alveta Heimlich Anesthesiologist:  Anesthesiologist: Molli Barrows, MD CRNA: Lia Foyer, CRNA; Doreen Salvage, CRNA  INDICATIONS: 24 y.o. G0 here for definitive surgical management secondary to the indications listed under preoperative diagnoses; please see preoperative note for further details.  Risks of surgery were discussed with the patient including but not limited to: bleeding which may require transfusion or reoperation; infection which may require antibiotics; injury to bowel, bladder, ureters or other surrounding organs; need for additional procedures; thromboembolic phenomenon, incisional problems and other postoperative/anesthesia complications. Written informed consent was obtained.    FINDINGS:  Fibroid uterus, multiple areas of endometriosis implants on peritoneum. Adhesions between omentum and right pelvic side wall, adhesions at sigmoid flexure.   ANESTHESIA:    General INTRAVENOUS FLUIDS:1000  ml ESTIMATED BLOOD LOSS:20 ml URINE OUTPUT: 800 ml   SPECIMENS: Uterus, cervix, bilateral fallopian tube remnants  COMPLICATIONS: None immediate  PROCEDURE IN DETAIL:  The patient received prophalactic intravenous antibiotics and had sequential compression devices applied to her lower extremities while in the preoperative area.  She was then taken to the operating room where general anesthesia was administered and was found to be adequate.  She was placed in the dorsal lithotomy position, and was prepped and draped in a sterile manner.  A formal time out was performed with all team members present and in agreement.  A V-care uterine manipulator was placed at this  time.  A Foley catheter was inserted into her bladder and attached to constant drainage. Attention was turned to the abdomen and 0.5% Marcaine infused subq. A 1mm umbilical incision was made with the scalpel.  The Optiview 5-mm trocar and sleeve were then advanced without difficulty with the laparoscope under direct visualization into the abdomen.  The abdomen was then insufflated with carbon dioxide gas and adequate pneumoperitoneum was obtained.  A survey of the patient's pelvis and abdomen revealed the findings above.  Bilateral lower quadrant ports (5 mm on the right and 11 mm on the left) were then placed under direct visualization.  The pelvis was then carefully examined.  Attention was turned to the fallopian tubes; these were freed from the underlying mesosalpinx and the uterine attachments using the Ligasure device.  The bilateral round and broad ligaments were then clamped and transected with the Ligasure device.  The uterine artery was then skeletonized and a bladder flap was created.  The ureters were noted to be safely away from the area of dissection.  The bladder was then bluntly dissected off the lower uterine segment.    At this point, attention was turned to the uterine vessels, which were clamped and cauterized using the Ligasure on the left, and then the right. After the uterine blood flow at the level of the internal os was controlled, both arteries were cut with the Ligasure.  Good hemostasis was noted overall.  The uterosacral and cardinal ligaments were clamped, cut and ligated bilaterally .  Attention was then turned to the cervicovaginal junction, and monopolar scissors were used to transect the cervix from the surrounding vagina using the ring of the V-care as a guide. This was done circumferentially allowing total hysterectomy.  The uterus was then removed from the vagina and the vaginal cuff incision was then closed with running V-loc suture.  Overall excellent hemostasis was noted.     Endometriosis implants excised throughout the pelvis, along left ureter, left uterosacral, right posterior cul de sac, right anterior pelvis, right ureterorectal ligament. Adhesions taken down bluntly and sharply along left pelvic sidewall and sigmoid colon, and right lower quadrant.  Attention was returned to the abdomen.The ureters were reexamined bilaterally and were pulsating normally. The abdominal pressure was reduced and hemostasis was confirmed.    No stitches were visualized in the bladder during cystoscopy.  The 15mm port fascia was closed with a vertical mattress with 0-Vicryl, using the cone closure system. All trocars were removed under direct visualization, and the abdomen was desufflated.  All skin incisions were closed with 4-0 Vicryl subcuticular stitches and Dermabond. The patient tolerated the procedures well.  All instruments, needles, and sponge counts were correct x 2. The patient was taken to the recovery room awake, extubated and in stable condition.   Plan for same day discharge with f/u in office in 2 weeks. Consider lupron for endometriosis suppression.

## 2019-10-14 NOTE — Transfer of Care (Signed)
Immediate Anesthesia Transfer of Care Note  Patient: Madison Morrow  Procedure(s) Performed: HYSTERECTOMY TOTAL LAPAROSCOPIC, ENDOMETRIOSIS EXCISION (N/A )  Patient Location: PACU  Anesthesia Type:General  Level of Consciousness: drowsy  Airway & Oxygen Therapy: Patient Spontanous Breathing and Patient connected to face mask oxygen  Post-op Assessment: Report given to RN and Post -op Vital signs reviewed and stable  Post vital signs: Reviewed and stable  Last Vitals:  Vitals Value Taken Time  BP 121/64 10/14/19 1437  Temp 37.2 C 10/14/19 1437  Pulse 95 10/14/19 1440  Resp 21 10/14/19 1440  SpO2 100 % 10/14/19 1440  Vitals shown include unvalidated device data.  Last Pain:  Vitals:   10/14/19 1437  TempSrc:   PainSc: Asleep         Complications: No apparent anesthesia complications

## 2019-10-14 NOTE — Anesthesia Preprocedure Evaluation (Signed)
Anesthesia Evaluation  Patient identified by MRN, date of birth, ID band Patient awake    Reviewed: Allergy & Precautions, H&P , NPO status , Patient's Chart, lab work & pertinent test results, reviewed documented beta blocker date and time   Airway Mallampati: II  TM Distance: >3 FB Neck ROM: full    Dental  (+) Teeth Intact   Pulmonary asthma ,    Pulmonary exam normal        Cardiovascular Exercise Tolerance: Good negative cardio ROS Normal cardiovascular exam Rhythm:regular Rate:Normal     Neuro/Psych negative neurological ROS  negative psych ROS   GI/Hepatic negative GI ROS, Neg liver ROS,   Endo/Other  negative endocrine ROS  Renal/GU negative Renal ROS  negative genitourinary   Musculoskeletal   Abdominal   Peds  Hematology  (+) Blood dyscrasia, anemia ,   Anesthesia Other Findings Past Medical History: No date: Anemia No date: Asthma     Comment:  seasonal allergies Past Surgical History: No date: BILATERAL SALPINGECTOMY No date: WISDOM TOOTH EXTRACTION   Reproductive/Obstetrics negative OB ROS                             Anesthesia Physical Anesthesia Plan  ASA: II  Anesthesia Plan: General ETT   Post-op Pain Management:    Induction:   PONV Risk Score and Plan: 4 or greater  Airway Management Planned:   Additional Equipment:   Intra-op Plan:   Post-operative Plan:   Informed Consent: I have reviewed the patients History and Physical, chart, labs and discussed the procedure including the risks, benefits and alternatives for the proposed anesthesia with the patient or authorized representative who has indicated his/her understanding and acceptance.     Dental Advisory Given  Plan Discussed with: CRNA  Anesthesia Plan Comments:         Anesthesia Quick Evaluation

## 2019-10-14 NOTE — Anesthesia Procedure Notes (Signed)
Procedure Name: Intubation Date/Time: 10/14/2019 12:28 PM Performed by: Lia Foyer, CRNA Pre-anesthesia Checklist: Patient identified, Emergency Drugs available, Suction available and Patient being monitored Patient Re-evaluated:Patient Re-evaluated prior to induction Oxygen Delivery Method: Circle system utilized Preoxygenation: Pre-oxygenation with 100% oxygen Induction Type: IV induction Ventilation: Mask ventilation without difficulty Laryngoscope Size: McGraph and 3 Grade View: Grade I Tube type: Oral Number of attempts: 1 Airway Equipment and Method: Stylet,  Oral airway,  Video-laryngoscopy and Patient positioned with wedge pillow Placement Confirmation: ETT inserted through vocal cords under direct vision,  positive ETCO2 and breath sounds checked- equal and bilateral Secured at: 20 cm Tube secured with: Tape Dental Injury: Teeth and Oropharynx as per pre-operative assessment

## 2019-10-14 NOTE — Interval H&P Note (Signed)
Pt seen and no medical changes. We will proceed with surgery. She confirms that she has changed her mind and would like an open surgery if unable to be completed laparoscopically. This is a change from her H&P stated preference.

## 2019-10-15 NOTE — Anesthesia Postprocedure Evaluation (Signed)
Anesthesia Post Note  Patient: Systems analyst  Procedure(s) Performed: HYSTERECTOMY TOTAL LAPAROSCOPIC, ENDOMETRIOSIS EXCISION (N/A )  Patient location during evaluation: PACU Anesthesia Type: General Level of consciousness: awake and alert Pain management: pain level controlled Vital Signs Assessment: post-procedure vital signs reviewed and stable Respiratory status: spontaneous breathing, nonlabored ventilation, respiratory function stable and patient connected to nasal cannula oxygen Cardiovascular status: blood pressure returned to baseline and stable Postop Assessment: no apparent nausea or vomiting Anesthetic complications: no     Last Vitals:  Vitals:   10/14/19 1611 10/14/19 1714  BP: (!) 109/59 134/80  Pulse: 81 82  Resp: 16 16  Temp: 36.8 C   SpO2: 99% 100%    Last Pain:  Vitals:   10/15/19 0958  TempSrc:   PainSc: 2                  Molli Barrows

## 2019-10-16 LAB — SURGICAL PATHOLOGY

## 2020-04-02 ENCOUNTER — Encounter: Payer: Self-pay | Admitting: Dermatology

## 2020-04-02 ENCOUNTER — Other Ambulatory Visit: Payer: Self-pay

## 2020-04-02 ENCOUNTER — Ambulatory Visit: Payer: Commercial Managed Care - PPO | Admitting: Dermatology

## 2020-04-02 DIAGNOSIS — Z808 Family history of malignant neoplasm of other organs or systems: Secondary | ICD-10-CM | POA: Diagnosis not present

## 2020-04-02 DIAGNOSIS — L209 Atopic dermatitis, unspecified: Secondary | ICD-10-CM

## 2020-04-02 DIAGNOSIS — L578 Other skin changes due to chronic exposure to nonionizing radiation: Secondary | ICD-10-CM

## 2020-04-02 DIAGNOSIS — D485 Neoplasm of uncertain behavior of skin: Secondary | ICD-10-CM

## 2020-04-02 DIAGNOSIS — Z85828 Personal history of other malignant neoplasm of skin: Secondary | ICD-10-CM

## 2020-04-02 DIAGNOSIS — L814 Other melanin hyperpigmentation: Secondary | ICD-10-CM

## 2020-04-02 DIAGNOSIS — D229 Melanocytic nevi, unspecified: Secondary | ICD-10-CM

## 2020-04-02 DIAGNOSIS — Z1283 Encounter for screening for malignant neoplasm of skin: Secondary | ICD-10-CM | POA: Diagnosis not present

## 2020-04-02 DIAGNOSIS — L821 Other seborrheic keratosis: Secondary | ICD-10-CM

## 2020-04-02 DIAGNOSIS — D1800 Hemangioma unspecified site: Secondary | ICD-10-CM

## 2020-04-02 DIAGNOSIS — Z86018 Personal history of other benign neoplasm: Secondary | ICD-10-CM

## 2020-04-02 MED ORDER — MUPIROCIN 2 % EX OINT: TOPICAL_OINTMENT | CUTANEOUS | 2 refills | Status: DC

## 2020-04-02 MED ORDER — MOMETASONE FUROATE 0.1 % EX CREA: TOPICAL_CREAM | CUTANEOUS | 1 refills | Status: DC

## 2020-04-02 NOTE — Patient Instructions (Addendum)
Melanoma ABCDEs  Melanoma is the most dangerous type of skin cancer, and is the leading cause of death from skin disease.  You are more likely to develop melanoma if you:  Have light-colored skin, light-colored eyes, or red or blond hair  Spend a lot of time in the sun  Tan regularly, either outdoors or in a tanning bed  Have had blistering sunburns, especially during childhood  Have a close family member who has had a melanoma  Have atypical moles or large birthmarks  Early detection of melanoma is key since treatment is typically straightforward and cure rates are extremely high if we catch it early.   The first sign of melanoma is often a change in a mole or a new dark spot.  The ABCDE system is a way of remembering the signs of melanoma.  A for asymmetry:  The two halves do not match. B for border:  The edges of the growth are irregular. C for color:  A mixture of colors are present instead of an even brown color. D for diameter:  Melanomas are usually (but not always) greater than 6mm - the size of a pencil eraser. E for evolution:  The spot keeps changing in size, shape, and color.  Please check your skin once per month between visits. You can use a small mirror in front and a large mirror behind you to keep an eye on the back side or your body.   If you see any new or changing lesions before your next follow-up, please call to schedule a visit.  Please continue daily skin protection including broad spectrum sunscreen SPF 30+ to sun-exposed areas, reapplying every 2 hours as needed when you're outdoors.   Wound Care Instructions  1. Cleanse wound gently with soap and water once a day then pat dry with clean gauze. Apply a thing coat of Petrolatum (petroleum jelly, "Vaseline") over the wound (unless you have an allergy to this). We recommend that you use a new, sterile tube of Vaseline. Do not pick or remove scabs. Do not remove the yellow or white "healing tissue" from the  base of the wound.  2. Cover the wound with fresh, clean, nonstick gauze and secure with paper tape. You may use Band-Aids in place of gauze and tape if the would is small enough, but would recommend trimming much of the tape off as there is often too much. Sometimes Band-Aids can irritate the skin.  3. You should call the office for your biopsy report after 1 week if you have not already been contacted.  4. If you experience any problems, such as abnormal amounts of bleeding, swelling, significant bruising, significant pain, or evidence of infection, please call the office immediately.  5. FOR ADULT SURGERY PATIENTS: If you need something for pain relief you may take 1 extra strength Tylenol (acetaminophen) AND 2 Ibuprofen (200mg each) together every 4 hours as needed for pain. (do not take these if you are allergic to them or if you have a reason you should not take them.) Typically, you may only need pain medication for 1 to 3 days.      

## 2020-04-02 NOTE — Progress Notes (Signed)
Follow-Up Visit   Subjective  Madison Morrow is a 24 y.o. female who presents for the following: Annual Exam (Hx dysplastic nevi, BCC, fhx of MM - patient has noticed changing nevi on the face that she would like checked). The patient presents for Total-Body Skin Exam (TBSE) for skin cancer screening and mole check.  The following portions of the chart were reviewed this encounter and updated as appropriate:  Tobacco  Allergies  Meds  Problems  Med Hx  Surg Hx  Fam Hx     Review of Systems:  No other skin or systemic complaints except as noted in HPI or Assessment and Plan.  Objective  Well appearing patient in no apparent distress; mood and affect are within normal limits.  A full examination was performed including scalp, head, eyes, ears, nose, lips, neck, chest, axillae, abdomen, back, buttocks, bilateral upper extremities, bilateral lower extremities, hands, feet, fingers, toes, fingernails, and toenails. All findings within normal limits unless otherwise noted below.  Objective  R temple at hairline: 0.8 cm irregular brown macule   Objective  R ear tragus: 0.7 cm irregular brown macule   Objective  L sup lat scapula/post shoulder: 1.1 cm irregular brown macule  Objective  L post deltoid: 0.6 cm irregular brown macule  Objective  L prox tricep: 0.6 cm irregular brown macule   Objective  L distal lat deltoid: 0.7 cm irregular brown macule   Objective  Right Antecubital Fossa: Pink patch   Assessment & Plan  Family history of melanoma heredity In grandfather  -  Call clinic for new or changing lesions.  Recommend regular skin exams, daily broad-spectrum spf 30+ sunscreen use, and photoprotection.     Neoplasm of uncertain behavior of skin (6) R temple at hairline  Epidermal / dermal shaving  Lesion diameter (cm):  0.8 Informed consent: discussed and consent obtained   Timeout: patient name, date of birth, surgical site, and procedure verified     Procedure prep:  Patient was prepped and draped in usual sterile fashion Prep type:  Isopropyl alcohol Anesthesia: the lesion was anesthetized in a standard fashion   Anesthetic:  1% lidocaine w/ epinephrine 1-100,000 buffered w/ 8.4% NaHCO3 Instrument used: flexible razor blade   Hemostasis achieved with: pressure, aluminum chloride and electrodesiccation   Outcome: patient tolerated procedure well   Post-procedure details: sterile dressing applied and wound care instructions given   Dressing type: bandage and petrolatum    mupirocin ointment (BACTROBAN) 2 %  R ear tragus  Skin / nail biopsy Type of biopsy: tangential   Informed consent: discussed and consent obtained   Timeout: patient name, date of birth, surgical site, and procedure verified   Procedure prep:  Patient was prepped and draped in usual sterile fashion Prep type:  Isopropyl alcohol Anesthesia: the lesion was anesthetized in a standard fashion   Anesthetic:  1% lidocaine w/ epinephrine 1-100,000 buffered w/ 8.4% NaHCO3 Instrument used: flexible razor blade   Hemostasis achieved with: pressure, aluminum chloride and electrodesiccation   Outcome: patient tolerated procedure well   Post-procedure details: sterile dressing applied and wound care instructions given   Dressing type: bandage and petrolatum    L sup lat scapula/post shoulder  Epidermal / dermal shaving  Lesion diameter (cm):  1.1 Informed consent: discussed and consent obtained   Timeout: patient name, date of birth, surgical site, and procedure verified   Procedure prep:  Patient was prepped and draped in usual sterile fashion Prep type:  Isopropyl alcohol Anesthesia: the  lesion was anesthetized in a standard fashion   Anesthetic:  1% lidocaine w/ epinephrine 1-100,000 buffered w/ 8.4% NaHCO3 Instrument used: flexible razor blade   Hemostasis achieved with: pressure, aluminum chloride and electrodesiccation   Outcome: patient tolerated procedure  well   Post-procedure details: sterile dressing applied and wound care instructions given   Dressing type: bandage and petrolatum    L post deltoid  Epidermal / dermal shaving  Lesion diameter (cm):  0.6 Informed consent: discussed and consent obtained   Timeout: patient name, date of birth, surgical site, and procedure verified   Procedure prep:  Patient was prepped and draped in usual sterile fashion Prep type:  Isopropyl alcohol Anesthesia: the lesion was anesthetized in a standard fashion   Anesthetic:  1% lidocaine w/ epinephrine 1-100,000 buffered w/ 8.4% NaHCO3 Instrument used: flexible razor blade   Hemostasis achieved with: pressure, aluminum chloride and electrodesiccation   Outcome: patient tolerated procedure well   Post-procedure details: sterile dressing applied and wound care instructions given   Dressing type: bandage and petrolatum    L prox tricep  Epidermal / dermal shaving  Lesion diameter (cm):  0.6 Informed consent: discussed and consent obtained   Timeout: patient name, date of birth, surgical site, and procedure verified   Procedure prep:  Patient was prepped and draped in usual sterile fashion Prep type:  Isopropyl alcohol Anesthesia: the lesion was anesthetized in a standard fashion   Anesthetic:  1% lidocaine w/ epinephrine 1-100,000 buffered w/ 8.4% NaHCO3 Instrument used: flexible razor blade   Hemostasis achieved with: pressure, aluminum chloride and electrodesiccation   Outcome: patient tolerated procedure well   Post-procedure details: sterile dressing applied and wound care instructions given   Dressing type: bandage and petrolatum    L distal lat deltoid  Epidermal / dermal shaving  Lesion diameter (cm):  0.7 Informed consent: discussed and consent obtained   Timeout: patient name, date of birth, surgical site, and procedure verified   Procedure prep:  Patient was prepped and draped in usual sterile fashion Prep type:  Isopropyl  alcohol Anesthesia: the lesion was anesthetized in a standard fashion   Anesthetic:  1% lidocaine w/ epinephrine 1-100,000 buffered w/ 8.4% NaHCO3 Instrument used: flexible razor blade   Hemostasis achieved with: pressure, aluminum chloride and electrodesiccation   Outcome: patient tolerated procedure well   Post-procedure details: sterile dressing applied and wound care instructions given   Dressing type: bandage and petrolatum    Apply Mupirocin 2% oint to aa's QD until healed.  Other Related Procedures Anatomic Pathology Report  Atopic dermatitis, unspecified type Right Antecubital Fossa Start Mometasone 0.1% cream to aa's BID PRN flares. Avoid f/g/a.  mometasone (ELOCON) 0.1 % cream - Right Antecubital Fossa    Lentigines - Scattered tan macules - Discussed due to sun exposure - Benign, observe - Call for any changes  Seborrheic Keratoses - Stuck-on, waxy, tan-brown papules and plaques  - Discussed benign etiology and prognosis. - Observe - Call for any changes  Melanocytic Nevi - Tan-brown and/or pink-flesh-colored symmetric macules and papules - Benign appearing on exam today - Observation - Call clinic for new or changing moles - Recommend daily use of broad spectrum spf 30+ sunscreen to sun-exposed areas.   Hemangiomas - Red papules - Discussed benign nature - Observe - Call for any changes  Actinic Damage - diffuse scaly erythematous macules with underlying dyspigmentation - Recommend daily broad spectrum sunscreen SPF 30+ to sun-exposed areas, reapply every 2 hours as needed.  - Call  for new or changing lesions.  History of Basal Cell Carcinoma of the Skin - L chest, R bicep treated in [place] - No evidence of recurrence today - Recommend regular full body skin exams - Recommend daily broad spectrum sunscreen SPF 30+ to sun-exposed areas, reapply every 2 hours as needed.  - Call if any new or changing lesions are noted between office visits  History of  Dysplastic Nevi - No evidence of recurrence today - Recommend regular full body skin exams - Recommend daily broad spectrum sunscreen SPF 30+ to sun-exposed areas, reapply every 2 hours as needed.  - Call if any new or changing lesions are noted between office visits  Skin cancer screening performed today.  Return in about 6 months (around 09/30/2020) for TBSE.  Luther Redo, CMA, am acting as scribe for Sarina Ser, MD .  Documentation: I have reviewed the above documentation for accuracy and completeness, and I agree with the above.  Sarina Ser, MD

## 2020-04-03 ENCOUNTER — Encounter: Payer: Self-pay | Admitting: Dermatology

## 2020-04-07 LAB — ANATOMIC PATHOLOGY REPORT

## 2020-04-08 ENCOUNTER — Telehealth: Payer: Self-pay

## 2020-04-08 NOTE — Telephone Encounter (Signed)
-----   Message from Ralene Bathe, MD sent at 04/07/2020  2:55 PM EDT ----- Comment: Specimen A-Skin Biopsy, Shave, temple right hairline:  COMPOUND DYSPLASTIC NEVUS WITH MILD TO MODERATE ATYPIA,  MARGINS UNINVOLVED, SEE COMMENT  Specimen B-Skin Biopsy, Shave, ear tragus right: COMPOUND  Specimen C-Skin Biopsy, Shave, scapula post shoulder left  superior lat: COMPOUND DYSPLASTIC NEVUS WITH MILD ATYPIA,  MARGINS UNINVOLVED, SEE COMMENT  Specimen D-Skin Biopsy, Shave, deltoid left posterior:  COMPOUND DYSPLASTIC NEVUS WITH MILD ATYPIA, MARGINS  UNINVOLVED, SEE COMMENT  Specimen E-Skin Biopsy, Shave, tricep left proximal:  COMPOUND NEVUS  Specimen F-Skin Biopsy, Shave, deltoid distal left lateral:  COMPOUND DYSPLASTIC NEVUS WITH MILD ATYPIA, MARGINS  UNINVOLVED, SEE COMMENT   A - Dysplastic Mild to moderate Recheck next visit B- Benign mole C- Dysplastic Mild Recheck next visit D- Dysplastic Mild Recheck next visit E- Benign mole F- dysplastic Mild Recheck next visit - keep March 2022 visit

## 2020-04-08 NOTE — Telephone Encounter (Signed)
Patient informed of pathology results 

## 2020-09-30 ENCOUNTER — Encounter: Payer: Commercial Managed Care - PPO | Admitting: Dermatology

## 2020-12-09 ENCOUNTER — Encounter: Payer: Self-pay | Admitting: Dermatology

## 2020-12-09 ENCOUNTER — Other Ambulatory Visit: Payer: Self-pay

## 2020-12-09 ENCOUNTER — Ambulatory Visit: Payer: Commercial Managed Care - PPO | Admitting: Dermatology

## 2020-12-09 DIAGNOSIS — D2262 Melanocytic nevi of left upper limb, including shoulder: Secondary | ICD-10-CM

## 2020-12-09 DIAGNOSIS — Z1283 Encounter for screening for malignant neoplasm of skin: Secondary | ICD-10-CM

## 2020-12-09 DIAGNOSIS — Z86018 Personal history of other benign neoplasm: Secondary | ICD-10-CM | POA: Diagnosis not present

## 2020-12-09 DIAGNOSIS — Z85828 Personal history of other malignant neoplasm of skin: Secondary | ICD-10-CM

## 2020-12-09 DIAGNOSIS — D239 Other benign neoplasm of skin, unspecified: Secondary | ICD-10-CM

## 2020-12-09 DIAGNOSIS — D229 Melanocytic nevi, unspecified: Secondary | ICD-10-CM

## 2020-12-09 DIAGNOSIS — L821 Other seborrheic keratosis: Secondary | ICD-10-CM

## 2020-12-09 DIAGNOSIS — L578 Other skin changes due to chronic exposure to nonionizing radiation: Secondary | ICD-10-CM

## 2020-12-09 DIAGNOSIS — D1800 Hemangioma unspecified site: Secondary | ICD-10-CM

## 2020-12-09 DIAGNOSIS — L814 Other melanin hyperpigmentation: Secondary | ICD-10-CM

## 2020-12-09 DIAGNOSIS — D235 Other benign neoplasm of skin of trunk: Secondary | ICD-10-CM

## 2020-12-09 NOTE — Patient Instructions (Signed)

## 2020-12-09 NOTE — Progress Notes (Signed)
Follow-Up Visit   Subjective  Madison Morrow is a 25 y.o. female who presents for the following: Annual Exam (History of dysplastic nevi - TBSE today). The patient presents for Total-Body Skin Exam (TBSE) for skin cancer screening and mole check.  The following portions of the chart were reviewed this encounter and updated as appropriate:   Tobacco  Allergies  Meds  Problems  Med Hx  Surg Hx  Fam Hx     Review of Systems:  No other skin or systemic complaints except as noted in HPI or Assessment and Plan.  Objective  Well appearing patient in no apparent distress; mood and affect are within normal limits.  A full examination was performed including scalp, head, eyes, ears, nose, lips, neck, chest, axillae, abdomen, back, buttocks, bilateral upper extremities, bilateral lower extremities, hands, feet, fingers, toes, fingernails, and toenails. All findings within normal limits unless otherwise noted below.  Objective  Left post deltoid: Well healed biopsy site with 0.3 cm area of repigmentation.  Objective  Left prox tricep: Well healed biopsy site with repigmentation.   Assessment & Plan    Lentigines - Scattered tan macules - Due to sun exposure - Benign-appering, observe - Recommend daily broad spectrum sunscreen SPF 30+ to sun-exposed areas, reapply every 2 hours as needed. - Call for any changes  Seborrheic Keratoses - Stuck-on, waxy, tan-brown papules and/or plaques  - Benign-appearing - Discussed benign etiology and prognosis. - Observe - Call for any changes  Melanocytic Nevi - Tan-brown and/or pink-flesh-colored symmetric macules and papules - Benign appearing on exam today - Observation - Call clinic for new or changing moles - Recommend daily use of broad spectrum spf 30+ sunscreen to sun-exposed areas.   Hemangiomas - Red papules - Discussed benign nature - Observe - Call for any changes  Actinic Damage - Chronic condition, secondary to  cumulative UV/sun exposure - diffuse scaly erythematous macules with underlying dyspigmentation - Recommend daily broad spectrum sunscreen SPF 30+ to sun-exposed areas, reapply every 2 hours as needed.  - Staying in the shade or wearing long sleeves, sun glasses (UVA+UVB protection) and wide brim hats (4-inch brim around the entire circumference of the hat) are also recommended for sun protection.  - Call for new or changing lesions.  Skin cancer screening performed today.  History of Basal Cell Carcinoma of the Skin - No evidence of recurrence today - Recommend regular full body skin exams - Recommend daily broad spectrum sunscreen SPF 30+ to sun-exposed areas, reapply every 2 hours as needed.  - Call if any new or changing lesions are noted between office visits  History of Dysplastic Nevi - No evidence of recurrence today - Recommend regular full body skin exams - Recommend daily broad spectrum sunscreen SPF 30+ to sun-exposed areas, reapply every 2 hours as needed.  - Call if any new or changing lesions are noted between office visits  Dysplastic nevus Left post deltoid  Biopsy proven Repigmented dysplastic nevus   Epidermal / dermal shaving - Left post deltoid  Lesion diameter (cm):  0.3 Informed consent: discussed and consent obtained   Timeout: patient name, date of birth, surgical site, and procedure verified   Procedure prep:  Patient was prepped and draped in usual sterile fashion Prep type:  Isopropyl alcohol Anesthesia: the lesion was anesthetized in a standard fashion   Anesthetic:  1% lidocaine w/ epinephrine 1-100,000 buffered w/ 8.4% NaHCO3 Instrument used: flexible razor blade   Hemostasis achieved with: pressure, aluminum chloride and electrodesiccation  Outcome: patient tolerated procedure well   Post-procedure details: sterile dressing applied and wound care instructions given   Dressing type: bandage and petrolatum    Nevus Left prox tricep Biopsy  proven Compound melanocytic nevus with repigmentation - Benign.  Skin cancer screening  Return in about 6 months (around 06/11/2021) for TBSE.   I, Ashok Cordia, CMA, am acting as scribe for Sarina Ser, MD .  Documentation: I have reviewed the above documentation for accuracy and completeness, and I agree with the above.  Sarina Ser, MD

## 2020-12-18 ENCOUNTER — Encounter: Payer: Self-pay | Admitting: Dermatology

## 2021-03-14 ENCOUNTER — Emergency Department
Admission: EM | Admit: 2021-03-14 | Discharge: 2021-03-14 | Disposition: A | Discharge status: Home / Self Care | Payer: Commercial Managed Care - PPO | Attending: Emergency Medicine | Admitting: Emergency Medicine

## 2021-03-14 ENCOUNTER — Emergency Department: Payer: Commercial Managed Care - PPO

## 2021-03-14 ENCOUNTER — Other Ambulatory Visit: Payer: Self-pay

## 2021-03-14 DIAGNOSIS — M25512 Pain in left shoulder: Secondary | ICD-10-CM | POA: Diagnosis present

## 2021-03-14 DIAGNOSIS — J45909 Unspecified asthma, uncomplicated: Secondary | ICD-10-CM | POA: Insufficient documentation

## 2021-03-14 DIAGNOSIS — M542 Cervicalgia: Secondary | ICD-10-CM | POA: Insufficient documentation

## 2021-03-14 DIAGNOSIS — B029 Zoster without complications: Secondary | ICD-10-CM | POA: Insufficient documentation

## 2021-03-14 MED ORDER — HYDROMORPHONE HCL 1 MG/ML IJ SOLN
1.0000 mg | Freq: Once | INTRAMUSCULAR | Status: AC
Start: 2021-03-14 — End: 2021-03-14
  Administered 2021-03-14: 1 mg via INTRAMUSCULAR
  Filled 2021-03-14: qty 1

## 2021-03-14 MED ORDER — FAMCICLOVIR 500 MG PO TABS: 500.0000 mg | ORAL_TABLET | Freq: Three times a day (TID) | ORAL | 0 refills | Status: AC

## 2021-03-14 MED ORDER — PREDNISONE 10 MG (21) PO TBPK: ORAL_TABLET | ORAL | 0 refills | Status: AC

## 2021-03-14 MED ORDER — ONDANSETRON 4 MG PO TBDP
4.0000 mg | ORAL_TABLET | Freq: Once | ORAL | Status: AC
Start: 2021-03-14 — End: 2021-03-14
  Administered 2021-03-14: 4 mg via ORAL
  Filled 2021-03-14: qty 1

## 2021-03-14 MED ORDER — OXYCODONE-ACETAMINOPHEN 5-325 MG PO TABS: 1.0000 | ORAL_TABLET | ORAL | 0 refills | Status: AC | PRN

## 2021-03-14 MED ORDER — GABAPENTIN 300 MG PO CAPS: 300.0000 mg | ORAL_CAPSULE | Freq: Three times a day (TID) | ORAL | 0 refills | Status: AC

## 2021-03-14 MED ORDER — KETOROLAC TROMETHAMINE 30 MG/ML IJ SOLN
30.0000 mg | Freq: Once | INTRAMUSCULAR | Status: AC
Start: 2021-03-14 — End: 2021-03-14
  Administered 2021-03-14: 30 mg via INTRAMUSCULAR
  Filled 2021-03-14: qty 1

## 2021-03-14 NOTE — ED Notes (Signed)
See triage note  Presents with pain from neck into left shoulder and arm  States pain started on Sunday w/o known injury  Was seen and placed on pain meds and steroids    States pain has not gotten any better

## 2021-03-14 NOTE — Discharge Instructions (Addendum)
Take medication as prescribed.  Return emergency department worsening

## 2021-03-14 NOTE — ED Provider Notes (Signed)
[facility] Emergency Department Provider Note  ____________________________________________   Event Date/Time   First MD Initiated Contact with Patient 03/14/21 [phone]     (approximate)  I have reviewed the triage vital signs and the nursing notes.   HISTORY  Chief Complaint Neck Pain    HPI Madison Morrow is a 25 y.o. female presents emergency department complaining of left shoulder pain.  See HPI.  Patient states that she was seen at the urgent care and given 20 mg prednisone for 5 days along with muscle relaxer and hydrocodone.  States they did x-rays of her neck and shoulder.  Told her that she had some bulging disc in her neck.  Patient states the pain originally started in her shoulder.  She states I do not know if it is out of socket.  States she has severe pain with movement.  No rash that she knows of.  States she did burn herself with the heating pad.  No fever or chills  Past Medical History:  Diagnosis Date   Anemia    Asthma    seasonal allergies   Basal cell carcinoma    Treated in [place]    Dysplastic nevus 09/25/2019   L mid bicep    Dysplastic nevus 04/02/2020   Right temple hairline (mild-mod), left scapula/post shoulder (mild), left post deltoid (mild), left distal lat deltoid (mild)    There are no problems to display for this patient.   Past Surgical History:  Procedure Laterality Date   BILATERAL SALPINGECTOMY     LAPAROSCOPIC HYSTERECTOMY N/A 10/14/2019   Procedure: HYSTERECTOMY TOTAL LAPAROSCOPIC, ENDOMETRIOSIS EXCISION;  Surgeon: Benjaman Kindler, MD;  Location: [facility] ORS;  Service: Gynecology;  Laterality: N/A;   WISDOM TOOTH EXTRACTION      Prior to Admission medications   Medication Sig Start Date End Date Taking? Authorizing Provider  famciclovir (FAMVIR) 500 MG tablet Take 1 tablet (500 mg total) by mouth 3 (three) times daily. 03/14/21  Yes Michaelle Bottomley, Linden Dolin, PA-C  gabapentin (NEURONTIN) 300 MG capsule Take 1 capsule (300  mg total) by mouth 3 (three) times daily for 10 days. 03/14/21 03/24/21 Yes Gertude Benito, Linden Dolin, PA-C  oxyCODONE-acetaminophen (PERCOCET) 5-325 MG tablet Take 1 tablet by mouth every 4 (four) hours as needed for severe pain. 03/14/21 03/14/22 Yes Foxx Klarich, Linden Dolin, PA-C  predniSONE (STERAPRED UNI-PAK 21 TAB) 10 MG (21) TBPK tablet Take 6 pills on day one then decrease by 1 pill each day 03/14/21  Yes Verdine Grenfell, Linden Dolin, PA-C  albuterol (VENTOLIN HFA) 108 (90 Base) MCG/ACT inhaler Inhale 2 puffs into the lungs every 6 (six) hours as needed for shortness of breath. 09/17/19   [provider]  ascorbic acid (VITAMIN C) 500 MG tablet Take 500 mg by mouth daily.    [provider]  docusate sodium (COLACE) 100 MG capsule Take 1 capsule (100 mg total) by mouth 2 (two) times daily. To keep stools soft 10/14/19   Benjaman Kindler, MD  ferrous sulfate 325 (65 FE) MG tablet Take 325 mg by mouth daily with breakfast.    [provider]  mometasone (ELOCON) 0.1 % cream Apply to aa's rash BID PRN flares. Avoid f/g/a. 04/02/20   Ralene Bathe, MD  mupirocin ointment (BACTROBAN) 2 % Apply to aa's wound QD until healed. 04/02/20   Ralene Bathe, MD  senna (SENOKOT) 8.6 MG TABS tablet Take 1 tablet (8.6 mg total) by mouth daily as needed for mild constipation. 10/14/19   Leafy Ro,  Romelle Starcher, MD    Allergies Amoxicillin and Penicillins  History reviewed. No pertinent family history.  Social History Social History   Tobacco Use   Smoking status: Never   Smokeless tobacco: Never  Vaping Use   Vaping Use: Never used  Substance Use Topics   Alcohol use: Never   Drug use: Never    Review of Systems  Constitutional: No fever/chills Eyes: No visual changes. ENT: No sore throat. Respiratory: Denies cough Cardiovascular: Denies chest pain Gastrointestinal: Denies abdominal pain Genitourinary: Negative for dysuria. Musculoskeletal: Negative for back pain. Skin: Negative for rash. Psychiatric:  no mood changes,     ____________________________________________   PHYSICAL EXAM:  VITAL SIGNS: ED Triage Vitals [03/14/21 0753]  Enc Vitals Group     BP (!) 158/106     Pulse Rate 81     Resp 18     Temp 98.8 F (37.1 C)     Temp Source Oral     SpO2 100 %     Weight 173 lb (78.5 kg)     Height '5\' 6"'$  (1.676 m)     Head Circumference      Peak Flow      Pain Score 10     Pain Loc      Pain Edu?      Excl. in [place]?     Constitutional: Alert and oriented. Well appearing and in no acute distress. Eyes: Conjunctivae are normal.  Head: Atraumatic. Nose: No congestion/rhinnorhea. Mouth/Throat: Mucous membranes are moist.   Neck:  supple no lymphadenopathy noted Cardiovascular: Normal rate, regular rhythm. Heart sounds are normal Respiratory: Normal respiratory effort.  No retractions, lungs c t a  GU: deferred Musculoskeletal: Decreased range of motion of the left shoulder secondary to discomfort, grips are equal bilaterally, C-spine and left shoulder are tender to palpation, no rashes noted, neurovascular intact  neurologic:  Normal speech and language.  Skin:  Skin is warm, dry and intact. No rash noted. Psychiatric: Mood and affect are normal. Speech and behavior are normal.  ____________________________________________   LABS (all labs ordered are listed, but only abnormal results are displayed)  Labs Reviewed - No data to display ____________________________________________   ____________________________________________  RADIOLOGY  X-ray of the left shoulder  ____________________________________________   PROCEDURES  Procedure(s) performed: Toradol 30 mg IM   Procedures    ____________________________________________   INITIAL IMPRESSION / ASSESSMENT AND PLAN / ED COURSE  Pertinent labs & imaging results that were available during my care of the patient were reviewed by me and considered in my medical decision making (see chart for details).    Patient is a 25 year old female presents emergency department left shoulder pain.  See HPI.  Physical exam shows patient to appear stable.  Concerns for cervical radiculopathy.  Patient feels that the pain is mainly from her shoulder.  We will do a x-ray of the left shoulder.  If negative may proceed to MRI of the C-spine  X-ray of the left shoulder reviewed by me confirmed by radiology is negative  I went to lift the patient is sure to look at her burn from her heating pad.  When she lifted the sure there is a erythematous based vesicular rash along C7 dermatome.  Patient has a severe case of shingles.  We will do no further imaging.  We will give her pain medication and start antivirals.  She is to follow-up with her regular doctor if not improving to 3 days.  Return emergency department worsening.  Discharged in stable condition.  Rainn Imparato was evaluated in Emergency Department on 03/14/2021 for the symptoms described in the history of present illness. She was evaluated in the context of the global COVID-19 pandemic, which necessitated consideration that the patient might be at risk for infection with the SARS-CoV-2 virus that causes COVID-19. Institutional protocols and algorithms that pertain to the evaluation of patients at risk for COVID-19 are in a state of rapid change based on information released by regulatory bodies including the CDC and federal and state organizations. These policies and algorithms were followed during the patient's care in the ED.    As part of my medical decision making, I reviewed the following data within the [place] notes reviewed and incorporated, Old chart reviewed, Radiograph reviewed , Notes from prior ED visits, and [place] Controlled Substance Database  ____________________________________________   FINAL CLINICAL IMPRESSION(S) / ED DIAGNOSES  Final diagnoses:  Herpes zoster without complication      NEW MEDICATIONS STARTED  DURING THIS VISIT:  New Prescriptions   FAMCICLOVIR (FAMVIR) 500 MG TABLET    Take 1 tablet (500 mg total) by mouth 3 (three) times daily.   GABAPENTIN (NEURONTIN) 300 MG CAPSULE    Take 1 capsule (300 mg total) by mouth 3 (three) times daily for 10 days.   OXYCODONE-ACETAMINOPHEN (PERCOCET) 5-325 MG TABLET    Take 1 tablet by mouth every 4 (four) hours as needed for severe pain.   PREDNISONE (STERAPRED UNI-PAK 21 TAB) 10 MG (21) TBPK TABLET    Take 6 pills on day one then decrease by 1 pill each day     Note:  This document was prepared using Dragon voice recognition software and may include unintentional dictation errors.    Versie Starks, PA-C 03/14/21 1145    Vanessa [place], MD 03/14/21 [phone]

## 2021-03-14 NOTE — ED Triage Notes (Signed)
Pt states she has been having neck and left shoulder/arm pain since Sunday- pt states she was seen for it on Wednesday at the walk in clinic and her xray showed "swollen disks" and she was given medicine for it- pt states the pain has gotten worse and is tearful in triage- pt states the pain gets worse when moving her arm, but not when moving her neck

## 2021-06-14 ENCOUNTER — Ambulatory Visit: Payer: BC Managed Care – PPO | Admitting: Dermatology

## 2021-06-14 ENCOUNTER — Other Ambulatory Visit: Payer: Self-pay

## 2021-06-14 DIAGNOSIS — D2239 Melanocytic nevi of other parts of face: Secondary | ICD-10-CM

## 2021-06-14 DIAGNOSIS — D1801 Hemangioma of skin and subcutaneous tissue: Secondary | ICD-10-CM

## 2021-06-14 DIAGNOSIS — Z85828 Personal history of other malignant neoplasm of skin: Secondary | ICD-10-CM

## 2021-06-14 DIAGNOSIS — Z86018 Personal history of other benign neoplasm: Secondary | ICD-10-CM

## 2021-06-14 DIAGNOSIS — Z1283 Encounter for screening for malignant neoplasm of skin: Secondary | ICD-10-CM

## 2021-06-14 DIAGNOSIS — D485 Neoplasm of uncertain behavior of skin: Secondary | ICD-10-CM

## 2021-06-14 DIAGNOSIS — L578 Other skin changes due to chronic exposure to nonionizing radiation: Secondary | ICD-10-CM

## 2021-06-14 DIAGNOSIS — L814 Other melanin hyperpigmentation: Secondary | ICD-10-CM

## 2021-06-14 DIAGNOSIS — D2262 Melanocytic nevi of left upper limb, including shoulder: Secondary | ICD-10-CM

## 2021-06-14 MED ORDER — MUPIROCIN 2 % EX OINT: TOPICAL_OINTMENT | CUTANEOUS | 2 refills | Status: DC

## 2021-06-14 NOTE — Patient Instructions (Signed)

## 2021-06-14 NOTE — Progress Notes (Signed)
Follow-Up Visit   Subjective  Madison Morrow is a 25 y.o. female who presents for the following: Annual Exam (History of BCC and dysplastic nevi - TBSE today) and Nevus (Under nose - it was flat but it has become raised). The patient presents for Total-Body Skin Exam (TBSE) for skin cancer screening and mole check. The patient has spots, moles and lesions to be evaluated, some may be new or changing and the patient has concerns that these could be cancer.  The following portions of the chart were reviewed this encounter and updated as appropriate:   Tobacco  Allergies  Meds  Problems  Med Hx  Surg Hx  Fam Hx     Review of Systems:  No other skin or systemic complaints except as noted in HPI or Assessment and Plan.  Objective  Well appearing patient in no apparent distress; mood and affect are within normal limits.  A full examination was performed including scalp, head, eyes, ears, nose, lips, neck, chest, axillae, abdomen, back, buttocks, bilateral upper extremities, bilateral lower extremities, hands, feet, fingers, toes, fingernails, and toenails. All findings within normal limits unless otherwise noted below.  Right infranasal 0.7 cm brown papule  Left post deltoid medial 0.7 cm irregular brown macule  Left forearm 0.6 ccm irregular brown macule   Assessment & Plan   History of Basal Cell Carcinoma of the Skin - No evidence of recurrence today - Recommend regular full body skin exams - Recommend daily broad spectrum sunscreen SPF 30+ to sun-exposed areas, reapply every 2 hours as needed.  - Call if any new or changing lesions are noted between office visits  History of Dysplastic Nevi - No evidence of recurrence today - Recommend regular full body skin exams - Recommend daily broad spectrum sunscreen SPF 30+ to sun-exposed areas, reapply every 2 hours as needed.  - Call if any new or changing lesions are noted between office visits  Lentigines - Scattered tan  macules - Due to sun exposure - Benign-appearing, observe - Recommend daily broad spectrum sunscreen SPF 30+ to sun-exposed areas, reapply every 2 hours as needed. - Call for any changes  Melanocytic Nevi - Tan-brown and/or pink-flesh-colored symmetric macules and papules - Benign appearing on exam today - Observation - Call clinic for new or changing moles - Recommend daily use of broad spectrum spf 30+ sunscreen to sun-exposed areas.   Hemangiomas - Red papules - Discussed benign nature - Observe - Call for any changes  Actinic Damage - Chronic condition, secondary to cumulative UV/sun exposure - diffuse scaly erythematous macules with underlying dyspigmentation - Recommend daily broad spectrum sunscreen SPF 30+ to sun-exposed areas, reapply every 2 hours as needed.  - Staying in the shade or wearing long sleeves, sun glasses (UVA+UVB protection) and wide brim hats (4-inch brim around the entire circumference of the hat) are also recommended for sun protection.  - Call for new or changing lesions.  Skin cancer screening performed today.  Neoplasm of uncertain behavior of skin (3) Right infranasal Epidermal / dermal shaving Lesion diameter (cm):  0.7 Informed consent: discussed and consent obtained   Timeout: patient name, date of birth, surgical site, and procedure verified   Procedure prep:  Patient was prepped and draped in usual sterile fashion Prep type:  Isopropyl alcohol Anesthesia: the lesion was anesthetized in a standard fashion   Anesthetic:  1% lidocaine w/ epinephrine 1-100,000 buffered w/ 8.4% NaHCO3 Instrument used: flexible razor blade   Hemostasis achieved with: pressure, aluminum chloride  and electrodesiccation   Outcome: patient tolerated procedure well   Post-procedure details: sterile dressing applied and wound care instructions given   Dressing type: bandage and petrolatum    mupirocin ointment (BACTROBAN) 2 % Apply to aa's wound QD until  healed.  Left post deltoid medial Epidermal / dermal shaving Lesion diameter (cm):  0.7 Informed consent: discussed and consent obtained   Timeout: patient name, date of birth, surgical site, and procedure verified   Procedure prep:  Patient was prepped and draped in usual sterile fashion Prep type:  Isopropyl alcohol Anesthesia: the lesion was anesthetized in a standard fashion   Anesthetic:  1% lidocaine w/ epinephrine 1-100,000 buffered w/ 8.4% NaHCO3 Instrument used: flexible razor blade   Hemostasis achieved with: pressure, aluminum chloride and electrodesiccation   Outcome: patient tolerated procedure well   Post-procedure details: sterile dressing applied and wound care instructions given   Dressing type: bandage and petrolatum    mupirocin ointment (BACTROBAN) 2 % Apply to aa's wound QD until healed.  Left forearm Epidermal / dermal shaving Lesion diameter (cm):  0.6 Informed consent: discussed and consent obtained   Timeout: patient name, date of birth, surgical site, and procedure verified   Procedure prep:  Patient was prepped and draped in usual sterile fashion Prep type:  Isopropyl alcohol Anesthesia: the lesion was anesthetized in a standard fashion   Anesthetic:  1% lidocaine w/ epinephrine 1-100,000 buffered w/ 8.4% NaHCO3 Instrument used: flexible razor blade   Hemostasis achieved with: pressure, aluminum chloride and electrodesiccation   Outcome: patient tolerated procedure well   Post-procedure details: sterile dressing applied and wound care instructions given   Dressing type: bandage and petrolatum    mupirocin ointment (BACTROBAN) 2 % Apply to aa's wound QD until healed.  Related Procedures Anatomic Pathology Report  Skin cancer screening  Return in about 6 months (around 12/12/2021) for TBSE.  I, Ashok Cordia, CMA, am acting as scribe for Sarina Ser, MD . Documentation: I have reviewed the above documentation for accuracy and completeness, and I  agree with the above.  Sarina Ser, MD

## 2021-06-25 ENCOUNTER — Encounter: Payer: Self-pay | Admitting: Dermatology

## 2021-06-25 LAB — ANATOMIC PATHOLOGY REPORT

## 2021-06-29 ENCOUNTER — Telehealth: Payer: Self-pay

## 2021-06-29 NOTE — Telephone Encounter (Signed)
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  Specimen 2-Skin Biopsy, Left Posterior Deltoid Medial:  COMPOUND MELANOCYTIC NEVUS.  Specimen 3-Skin Biopsy, Left Forearm: COMPOUND MELANOCYTIC  NEVUS WITH MILD ARCHITECTURAL DISORDER AND MILD CYTOLOGIC  ATYPIA OF THE MELANOCYTES (DYSPLASTIC NEVUS, MILD). SEE  COMMENTS.   1&2 - both benign moles 3- mild dysplastic Recheck next visit

## 2021-06-29 NOTE — Telephone Encounter (Signed)
Left message on voicemail to return my call.  

## 2021-07-13 ENCOUNTER — Telehealth: Payer: Self-pay

## 2021-07-13 NOTE — Telephone Encounter (Signed)
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  Specimen 2-Skin Biopsy, Left Posterior Deltoid Medial:  COMPOUND MELANOCYTIC NEVUS.  Specimen 3-Skin Biopsy, Left Forearm: COMPOUND MELANOCYTIC  NEVUS WITH MILD ARCHITECTURAL DISORDER AND MILD CYTOLOGIC  ATYPIA OF THE MELANOCYTES (DYSPLASTIC NEVUS, MILD). SEE  COMMENTS.   1&2 - both benign moles 3- mild dysplastic Recheck next visit

## 2021-07-13 NOTE — Telephone Encounter (Signed)
Advised patient of results/hd  

## 2021-12-15 ENCOUNTER — Ambulatory Visit: Payer: BC Managed Care – PPO | Admitting: Dermatology

## 2022-03-17 ENCOUNTER — Ambulatory Visit: Payer: BC Managed Care – PPO | Admitting: Dermatology

## 2022-03-17 ENCOUNTER — Encounter: Payer: Self-pay | Admitting: Dermatology

## 2022-03-17 DIAGNOSIS — L2081 Atopic neurodermatitis: Secondary | ICD-10-CM

## 2022-03-17 DIAGNOSIS — D225 Melanocytic nevi of trunk: Secondary | ICD-10-CM

## 2022-03-17 DIAGNOSIS — Z85828 Personal history of other malignant neoplasm of skin: Secondary | ICD-10-CM

## 2022-03-17 DIAGNOSIS — D2272 Melanocytic nevi of left lower limb, including hip: Secondary | ICD-10-CM | POA: Diagnosis not present

## 2022-03-17 DIAGNOSIS — L578 Other skin changes due to chronic exposure to nonionizing radiation: Secondary | ICD-10-CM

## 2022-03-17 DIAGNOSIS — Z1283 Encounter for screening for malignant neoplasm of skin: Secondary | ICD-10-CM | POA: Diagnosis not present

## 2022-03-17 DIAGNOSIS — D2262 Melanocytic nevi of left upper limb, including shoulder: Secondary | ICD-10-CM | POA: Diagnosis not present

## 2022-03-17 DIAGNOSIS — Z86018 Personal history of other benign neoplasm: Secondary | ICD-10-CM

## 2022-03-17 DIAGNOSIS — L720 Epidermal cyst: Secondary | ICD-10-CM | POA: Diagnosis not present

## 2022-03-17 DIAGNOSIS — D2239 Melanocytic nevi of other parts of face: Secondary | ICD-10-CM | POA: Diagnosis not present

## 2022-03-17 DIAGNOSIS — Z808 Family history of malignant neoplasm of other organs or systems: Secondary | ICD-10-CM

## 2022-03-17 DIAGNOSIS — D492 Neoplasm of unspecified behavior of bone, soft tissue, and skin: Secondary | ICD-10-CM

## 2022-03-17 DIAGNOSIS — L209 Atopic dermatitis, unspecified: Secondary | ICD-10-CM

## 2022-03-17 DIAGNOSIS — D239 Other benign neoplasm of skin, unspecified: Secondary | ICD-10-CM

## 2022-03-17 DIAGNOSIS — D229 Melanocytic nevi, unspecified: Secondary | ICD-10-CM

## 2022-03-17 DIAGNOSIS — D2261 Melanocytic nevi of right upper limb, including shoulder: Secondary | ICD-10-CM

## 2022-03-17 MED ORDER — MOMETASONE FUROATE 0.1 % EX CREA
TOPICAL_CREAM | CUTANEOUS | 3 refills | Status: DC
Start: 2022-03-17 — End: 2023-09-21

## 2022-03-17 NOTE — Progress Notes (Signed)
Follow-Up Visit   Subjective  Madison Morrow is a 26 y.o. female who presents for the following: Annual Exam (Mole check ). Yearly mole check hx of BCC,hx of Dysplastic nevus. Family hx of Melanoma.  The patient presents for Total-Body Skin Exam (TBSE) for skin cancer screening and mole check.  The patient has spots, moles and lesions to be evaluated, some may be new or changing and the patient has concerns that these could be cancer.   The following portions of the chart were reviewed this encounter and updated as appropriate:   Tobacco  Allergies  Meds  Problems  Med Hx  Surg Hx  Fam Hx     Review of Systems:  No other skin or systemic complaints except as noted in HPI or Assessment and Plan.  Objective  Well appearing patient in no apparent distress; mood and affect are within normal limits.  A full examination was performed including scalp, head, eyes, ears, nose, lips, neck, chest, axillae, abdomen, back, buttocks, bilateral upper extremities, bilateral lower extremities, hands, feet, fingers, toes, fingernails, and toenails. All findings within normal limits unless otherwise noted below.  right proximal mandible 0.7 cm flat brown macule        right mid side 0.6 cm irregular brown macule        rectum rim 0.7 cm Subcutaneous nodule.   chest, arms, back, Multiple brown macules   left medial ankle 0.7 cm regular brown macule   left proximal lateral calf 0.5 cm regular brown macule   right proximal lateral volar forearm 0.6 cm slightly irregular border brown macule                               Assessment & Plan  Neoplasm of skin (2) right proximal mandible Epidermal / dermal shaving  Lesion diameter (cm):  0.7 Informed consent: discussed and consent obtained   Timeout: patient name, date of birth, surgical site, and procedure verified   Procedure prep:  Patient was prepped and draped in usual sterile fashion Prep  type:  Isopropyl alcohol Anesthesia: the lesion was anesthetized in a standard fashion   Anesthetic:  1% lidocaine w/ epinephrine 1-100,000 buffered w/ 8.4% NaHCO3 Hemostasis achieved with: pressure, aluminum chloride and electrodesiccation   Outcome: patient tolerated procedure well   Post-procedure details: sterile dressing applied and wound care instructions given   Dressing type: bandage and petrolatum    Specimen 1 - Surgical pathology Differential Diagnosis: R/O Dysplastic nevus  Check Margins: No  right mid side Epidermal / dermal shaving  Lesion diameter (cm):  0.6 Informed consent: discussed and consent obtained   Timeout: patient name, date of birth, surgical site, and procedure verified   Procedure prep:  Patient was prepped and draped in usual sterile fashion Prep type:  Isopropyl alcohol Anesthesia: the lesion was anesthetized in a standard fashion   Anesthetic:  1% lidocaine w/ epinephrine 1-100,000 buffered w/ 8.4% NaHCO3 Hemostasis achieved with: pressure, aluminum chloride and electrodesiccation   Outcome: patient tolerated procedure well   Post-procedure details: sterile dressing applied and wound care instructions given   Dressing type: bandage and petrolatum    Specimen 2 - Surgical pathology Differential Diagnosis: R/O Dysplastic nevus  Check Margins: No  Related Procedures Anatomic Pathology Report  Epidermal inclusion cyst rectum rim Benign-appearing. Exam most consistent with an epidermal inclusion cyst. Discussed that a cyst is a benign growth that can grow over time and sometimes get  irritated or inflamed. Recommend observation if it is not bothersome. Discussed option of surgical excision to remove it if it is growing, symptomatic, or other changes noted. Please call for new or changing lesions so they can be evaluated.   Nevus (4) left medial ankle; right proximal lateral volar forearm; left proximal lateral calf; chest, arms, back, See  photos Benign-appearing.  Observation.  Call clinic for new or changing lesions.  Recommend daily use of broad spectrum spf 30+ sunscreen to sun-exposed areas.   Atopic dermatitis Left breast  Atopic dermatitis (eczema) is a chronic, relapsing, pruritic condition that can significantly affect quality of life. It is often associated with allergic rhinitis and/or asthma and can require treatment with topical medications, phototherapy, or in severe cases biologic injectable medication (Dupixent; Adbry) or Oral JAK inhibitors.  Mometasone cream twice daily as needed up to 5 days a week until improved. related Medications mometasone (ELOCON) 0.1 % cream Apply to aa's rash BID PRN flares. Avoid f/g/a.  Family history of melanoma heredity In grandfather  -  Call clinic for new or changing lesions.  Recommend regular skin exams, daily broad-spectrum spf 30+ sunscreen use, and photoprotection.     Lentigines - Scattered tan macules - Due to sun exposure - Benign-appearing, observe - Recommend daily broad spectrum sunscreen SPF 30+ to sun-exposed areas, reapply every 2 hours as needed. - Call for any changes  Seborrheic Keratoses - Stuck-on, waxy, tan-brown papules and/or plaques  - Benign-appearing - Discussed benign etiology and prognosis. - Observe - Call for any changes  Melanocytic Nevi - Tan-brown and/or pink-flesh-colored symmetric macules and papules - Benign appearing on exam today - Observation - Call clinic for new or changing moles - Recommend daily use of broad spectrum spf 30+ sunscreen to sun-exposed areas.   Hemangiomas - Red papules - Discussed benign nature - Observe - Call for any changes  History of Dysplastic Nevi Multiple see history  - No evidence of recurrence today - Recommend regular full body skin exams - Recommend daily broad spectrum sunscreen SPF 30+ to sun-exposed areas, reapply every 2 hours as needed.  - Call if any new or changing lesions are  noted between office visits   History of Basal Cell Carcinoma of the Skin Right bicep Left chest  - No evidence of recurrence today - Recommend regular full body skin exams - Recommend daily broad spectrum sunscreen SPF 30+ to sun-exposed areas, reapply every 2 hours as needed.  - Call if any new or changing lesions are noted between office visits   Skin cancer screening performed today.   Return in about 6 months (around 09/17/2022) for TBSE, hx of Dyplastic nevus .  IMarye Round, CMA, am acting as scribe for Sarina Ser, MD .  Documentation: I have reviewed the above documentation for accuracy and completeness, and I agree with the above.  Sarina Ser, MD

## 2022-03-17 NOTE — Patient Instructions (Addendum)
Wound Care Instructions  Cleanse wound gently with soap and water once a day then pat dry with clean gauze. Apply a thin coat of Petrolatum (petroleum jelly, "Vaseline") over the wound (unless you have an allergy to this). We recommend that you use a new, sterile tube of Vaseline. Do not pick or remove scabs. Do not remove the yellow or white "healing tissue" from the base of the wound.  Cover the wound with fresh, clean, nonstick gauze and secure with paper tape. You may use Band-Aids in place of gauze and tape if the wound is small enough, but would recommend trimming much of the tape off as there is often too much. Sometimes Band-Aids can irritate the skin.  You should call the office for your biopsy report after 1 week if you have not already been contacted.  If you experience any problems, such as abnormal amounts of bleeding, swelling, significant bruising, significant pain, or evidence of infection, please call the office immediately.  FOR ADULT SURGERY PATIENTS: If you need something for pain relief you may take 1 extra strength Tylenol (acetaminophen) AND 2 Ibuprofen (200mg each) together every 4 hours as needed for pain. (do not take these if you are allergic to them or if you have a reason you should not take them.) Typically, you may only need pain medication for 1 to 3 days.     Due to recent changes in healthcare laws, you may see results of your pathology and/or laboratory studies on MyChart before the doctors have had a chance to review them. We understand that in some cases there may be results that are confusing or concerning to you. Please understand that not all results are received at the same time and often the doctors may need to interpret multiple results in order to provide you with the best plan of care or course of treatment. Therefore, we ask that you please give us 2 business days to thoroughly review all your results before contacting the office for clarification. Should  we see a critical lab result, you will be contacted sooner.   If You Need Anything After Your Visit  If you have any questions or concerns for your doctor, please call our main line at [phone] and press option 4 to reach your doctor's medical assistant. If no one answers, please leave a voicemail as directed and we will return your call as soon as possible. Messages left after 4 pm will be answered the following business day.   You may also send us a message via MyChart. We typically respond to MyChart messages within 1-2 business days.  For prescription refills, please ask your pharmacy to contact our office. Our fax number is [phone].  If you have an urgent issue when the clinic is closed that cannot wait until the next business day, you can page your doctor at the number below.    Please note that while we do our best to be available for urgent issues outside of office hours, we are not available 24/7.   If you have an urgent issue and are unable to reach us, you may choose to seek medical care at your doctor's office, retail clinic, urgent care center, or emergency room.  If you have a medical emergency, please immediately call 911 or go to the emergency department.  Pager Numbers  - Dr. Kowalski: [phone]  - Dr. Moye: [phone]  - Dr. Stewart: [phone]  In the event of inclement weather, please call our main line at   [phone] for an update on the status of any delays or closures.  Dermatology Medication Tips: Please keep the boxes that topical medications come in in order to help keep track of the instructions about where and how to use these. Pharmacies typically print the medication instructions only on the boxes and not directly on the medication tubes.   If your medication is too expensive, please contact our office at [phone] option 4 or send us a message through MyChart.   We are unable to tell what your co-pay for medications will be in  advance as this is different depending on your insurance coverage. However, we may be able to find a substitute medication at lower cost or fill out paperwork to get insurance to cover a needed medication.   If a prior authorization is required to get your medication covered by your insurance company, please allow us 1-2 business days to complete this process.  Drug prices often vary depending on where the prescription is filled and some pharmacies may offer cheaper prices.  The website www.goodrx.com contains coupons for medications through different pharmacies. The prices here do not account for what the cost may be with help from insurance (it may be cheaper with your insurance), but the website can give you the price if you did not use any insurance.  - You can print the associated coupon and take it with your prescription to the pharmacy.  - You may also stop by our office during regular business hours and pick up a GoodRx coupon card.  - If you need your prescription sent electronically to a different pharmacy, notify our office through [facility] MyChart or by phone at [phone] option 4.     Si Usted Necesita Algo Despus de Su Visita  Tambin puede enviarnos un mensaje a travs de MyChart. Por lo general respondemos a los mensajes de MyChart en el transcurso de 1 a 2 das hbiles.  Para renovar recetas, por favor pida a su farmacia que se ponga en contacto con nuestra oficina. Nuestro nmero de fax es el [phone].  Si tiene un asunto urgente cuando la clnica est cerrada y que no puede esperar hasta el siguiente da hbil, puede llamar/localizar a su doctor(a) al nmero que aparece a continuacin.   Por favor, tenga en cuenta que aunque hacemos todo lo posible para estar disponibles para asuntos urgentes fuera del horario de oficina, no estamos disponibles las 24 horas del da, los 7 das de la semana.   Si tiene un problema urgente y no puede comunicarse con nosotros, puede  optar por buscar atencin mdica  en el consultorio de su doctor(a), en una clnica privada, en un centro de atencin urgente o en una sala de emergencias.  Si tiene una emergencia mdica, por favor llame inmediatamente al 911 o vaya a la sala de emergencias.  Nmeros de bper  - Dr. Kowalski: [phone]  - Dra. Moye: [phone]  - Dra. Stewart: [phone]  En caso de inclemencias del tiempo, por favor llame a nuestra lnea principal al [phone] para una actualizacin sobre el estado de cualquier retraso o cierre.  Consejos para la medicacin en dermatologa: Por favor, guarde las cajas en las que vienen los medicamentos de uso tpico para ayudarle a seguir las instrucciones sobre dnde y cmo usarlos. Las farmacias generalmente imprimen las instrucciones del medicamento slo en las cajas y no directamente en los tubos del medicamento.   Si su medicamento es muy caro, por favor, pngase en contacto con   nuestra oficina llamando al [phone] y presione la opcin 4 o envenos un mensaje a travs de MyChart.   No podemos decirle cul ser su copago por los medicamentos por adelantado ya que esto es diferente dependiendo de la cobertura de su seguro. Sin embargo, es posible que podamos encontrar un medicamento sustituto a menor costo o llenar un formulario para que el seguro cubra el medicamento que se considera necesario.   Si se requiere una autorizacin previa para que su compaa de seguros cubra su medicamento, por favor permtanos de 1 a 2 das hbiles para completar este proceso.  Los precios de los medicamentos varan con frecuencia dependiendo del lugar de dnde se surte la receta y alguna farmacias pueden ofrecer precios ms baratos.  El sitio web www.goodrx.com tiene cupones para medicamentos de diferentes farmacias. Los precios aqu no tienen en cuenta lo que podra costar con la ayuda del seguro (puede ser ms barato con su seguro), pero el sitio web puede darle el  precio si no utiliz ningn seguro.  - Puede imprimir el cupn correspondiente y llevarlo con su receta a la farmacia.  - Tambin puede pasar por nuestra oficina durante el horario de atencin regular y recoger una tarjeta de cupones de GoodRx.  - Si necesita que su receta se enve electrnicamente a una farmacia diferente, informe a nuestra oficina a travs de MyChart de [place] o por telfono llamando al [phone] y presione la opcin 4.  

## 2022-03-20 ENCOUNTER — Encounter: Payer: Self-pay | Admitting: Dermatology

## 2022-03-22 ENCOUNTER — Telehealth: Payer: Self-pay

## 2022-03-22 LAB — ANATOMIC PATHOLOGY REPORT

## 2022-03-22 LAB — SPECIMEN STATUS REPORT

## 2022-03-22 NOTE — Telephone Encounter (Signed)
LM on VM please return my call  

## 2022-03-22 NOTE — Telephone Encounter (Signed)
-----   Message from Ralene Bathe, MD sent at 03/22/2022 11:39 AM EDT ----- Diagnosis synopsis: Comment  Comment: Specimen 1-Skin Biopsy, Right Proximal Mandible: COMPOUND  MELANOCYTIC NEVUS.  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  SEE COMMENTS.    1- Benign mole No further treatment needed 2- Mild dysplastic Recheck next visit

## 2022-03-29 ENCOUNTER — Telehealth: Payer: Self-pay

## 2022-03-29 NOTE — Telephone Encounter (Signed)
-----   Message from Ralene Bathe, MD sent at 03/22/2022 11:39 AM EDT ----- Diagnosis synopsis: Comment  Comment: Specimen 1-Skin Biopsy, Right Proximal Mandible: COMPOUND  MELANOCYTIC NEVUS.  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  SEE COMMENTS.    1- Benign mole No further treatment needed 2- Mild dysplastic Recheck next visit

## 2022-03-29 NOTE — Telephone Encounter (Signed)
Went over bx results with patient, she had no questions or concerns at this time. AF

## 2022-09-28 ENCOUNTER — Ambulatory Visit: Payer: BC Managed Care – PPO | Admitting: Dermatology

## 2022-12-15 ENCOUNTER — Ambulatory Visit: Payer: Managed Care, Other (non HMO) | Admitting: Dermatology

## 2022-12-15 ENCOUNTER — Encounter: Payer: Self-pay | Admitting: Dermatology

## 2022-12-15 VITALS — BP 138/84 | HR 84

## 2022-12-15 DIAGNOSIS — D229 Melanocytic nevi, unspecified: Secondary | ICD-10-CM

## 2022-12-15 DIAGNOSIS — Z85828 Personal history of other malignant neoplasm of skin: Secondary | ICD-10-CM

## 2022-12-15 DIAGNOSIS — L729 Follicular cyst of the skin and subcutaneous tissue, unspecified: Secondary | ICD-10-CM

## 2022-12-15 DIAGNOSIS — D2261 Melanocytic nevi of right upper limb, including shoulder: Secondary | ICD-10-CM | POA: Diagnosis not present

## 2022-12-15 DIAGNOSIS — Z1283 Encounter for screening for malignant neoplasm of skin: Secondary | ICD-10-CM

## 2022-12-15 DIAGNOSIS — L720 Epidermal cyst: Secondary | ICD-10-CM

## 2022-12-15 DIAGNOSIS — L814 Other melanin hyperpigmentation: Secondary | ICD-10-CM | POA: Diagnosis not present

## 2022-12-15 DIAGNOSIS — D1801 Hemangioma of skin and subcutaneous tissue: Secondary | ICD-10-CM | POA: Diagnosis not present

## 2022-12-15 DIAGNOSIS — L578 Other skin changes due to chronic exposure to nonionizing radiation: Secondary | ICD-10-CM

## 2022-12-15 DIAGNOSIS — W908XXA Exposure to other nonionizing radiation, initial encounter: Secondary | ICD-10-CM

## 2022-12-15 DIAGNOSIS — Z86018 Personal history of other benign neoplasm: Secondary | ICD-10-CM

## 2022-12-15 DIAGNOSIS — X32XXXA Exposure to sunlight, initial encounter: Secondary | ICD-10-CM

## 2022-12-15 DIAGNOSIS — D489 Neoplasm of uncertain behavior, unspecified: Secondary | ICD-10-CM

## 2022-12-15 DIAGNOSIS — Z7189 Other specified counseling: Secondary | ICD-10-CM

## 2022-12-15 DIAGNOSIS — L821 Other seborrheic keratosis: Secondary | ICD-10-CM

## 2022-12-15 DIAGNOSIS — D485 Neoplasm of uncertain behavior of skin: Secondary | ICD-10-CM

## 2022-12-15 MED ORDER — MUPIROCIN 2 % EX OINT
TOPICAL_OINTMENT | CUTANEOUS | 2 refills | Status: DC
Start: 2022-12-15 — End: 2023-09-21

## 2022-12-15 NOTE — Patient Instructions (Addendum)
Biopsy Wound Care Instructions  Leave the original bandage on for 24 hours if possible.  If the bandage becomes soaked or soiled before that time, it is OK to remove it and examine the wound.  A small amount of post-operative bleeding is normal.  If excessive bleeding occurs, remove the bandage, place gauze over the site and apply continuous pressure (no peeking) over the area for 30 minutes. If this does not work, please call our clinic as soon as possible or page your doctor if it is after hours.   Once a day, cleanse the wound with soap and water. It is fine to shower. If a thick crust develops you may use a Q-tip dipped into dilute hydrogen peroxide (mix 1:1 with water) to dissolve it.  Hydrogen peroxide can slow the healing process, so use it only as needed.    After washing, apply petroleum jelly (Vaseline) or an antibiotic ointment if your doctor prescribed one for you, followed by a bandage.    For best healing, the wound should be covered with a layer of ointment at all times. If you are not able to keep the area covered with a bandage to hold the ointment in place, this may mean re-applying the ointment several times a day.  Continue this wound care until the wound has healed and is no longer open.   Itching and mild discomfort is normal during the healing process. However, if you develop pain or severe itching, please call our office.   If you have any discomfort, you can take Tylenol (acetaminophen) or ibuprofen as directed on the bottle. (Please do not take these if you have an allergy to them or cannot take them for another reason).  Some redness, tenderness and white or yellow material in the wound is normal healing.  If the area becomes very sore and red, or develops a thick yellow-green material (pus), it may be infected; please notify us.    If you have stitches, return to clinic as directed to have the stitches removed. You will continue wound care for 2-3 days after the stitches  are removed.   Wound healing continues for up to one year following surgery. It is not unusual to experience pain in the scar from time to time during the interval.  If the pain becomes severe or the scar thickens, you should notify the office.    A slight amount of redness in a scar is expected for the first six months.  After six months, the redness will fade and the scar will soften and fade.  The color difference becomes less noticeable with time.  If there are any problems, return for a post-op surgery check at your earliest convenience.  To improve the appearance of the scar, you can use silicone scar gel, cream, or sheets (such as Mederma or Serica) every night for up to one year. These are available over the counter (without a prescription).  Please call our office at [phone] for any questions or concerns.       Melanoma ABCDEs  Melanoma is the most dangerous type of skin cancer, and is the leading cause of death from skin disease.  You are more likely to develop melanoma if you: Have light-colored skin, light-colored eyes, or red or blond hair Spend a lot of time in the sun Tan regularly, either outdoors or in a tanning bed Have had blistering sunburns, especially during childhood Have a close family member who has had a melanoma Have atypical moles   or large birthmarks  Early detection of melanoma is key since treatment is typically straightforward and cure rates are extremely high if we catch it early.   The first sign of melanoma is often a change in a mole or a new dark spot.  The ABCDE system is a way of remembering the signs of melanoma.  A for asymmetry:  The two halves do not match. B for border:  The edges of the growth are irregular. C for color:  A mixture of colors are present instead of an even brown color. D for diameter:  Melanomas are usually (but not always) greater than 6mm - the size of a pencil eraser. E for evolution:  The spot keeps changing in  size, shape, and color.  Please check your skin once per month between visits. You can use a small mirror in front and a large mirror behind you to keep an eye on the back side or your body.   If you see any new or changing lesions before your next follow-up, please call to schedule a visit.  Please continue daily skin protection including broad spectrum sunscreen SPF 30+ to sun-exposed areas, reapplying every 2 hours as needed when you're outdoors.   Staying in the shade or wearing long sleeves, sun glasses (UVA+UVB protection) and wide brim hats (4-inch brim around the entire circumference of the hat) are also recommended for sun protection.      Due to recent changes in healthcare laws, you may see results of your pathology and/or laboratory studies on MyChart before the doctors have had a chance to review them. We understand that in some cases there may be results that are confusing or concerning to you. Please understand that not all results are received at the same time and often the doctors may need to interpret multiple results in order to provide you with the best plan of care or course of treatment. Therefore, we ask that you please give us 2 business days to thoroughly review all your results before contacting the office for clarification. Should we see a critical lab result, you will be contacted sooner.   If You Need Anything After Your Visit  If you have any questions or concerns for your doctor, please call our main line at [phone] and press option 4 to reach your doctor's medical assistant. If no one answers, please leave a voicemail as directed and we will return your call as soon as possible. Messages left after 4 pm will be answered the following business day.   You may also send us a message via MyChart. We typically respond to MyChart messages within 1-2 business days.  For prescription refills, please ask your pharmacy to contact our office. Our fax number is  [phone].  If you have an urgent issue when the clinic is closed that cannot wait until the next business day, you can page your doctor at the number below.    Please note that while we do our best to be available for urgent issues outside of office hours, we are not available 24/7.   If you have an urgent issue and are unable to reach us, you may choose to seek medical care at your doctor's office, retail clinic, urgent care center, or emergency room.  If you have a medical emergency, please immediately call 911 or go to the emergency department.  Pager Numbers  - Dr. Kowalski: [phone]  - Dr. Moye: [phone]  - Dr. Stewart: [phone]  In the event   of inclement weather, please call our main line at [phone] for an update on the status of any delays or closures.  Dermatology Medication Tips: Please keep the boxes that topical medications come in in order to help keep track of the instructions about where and how to use these. Pharmacies typically print the medication instructions only on the boxes and not directly on the medication tubes.   If your medication is too expensive, please contact our office at [phone] option 4 or send us a message through MyChart.   We are unable to tell what your co-pay for medications will be in advance as this is different depending on your insurance coverage. However, we may be able to find a substitute medication at lower cost or fill out paperwork to get insurance to cover a needed medication.   If a prior authorization is required to get your medication covered by your insurance company, please allow us 1-2 business days to complete this process.  Drug prices often vary depending on where the prescription is filled and some pharmacies may offer cheaper prices.  The website www.goodrx.com contains coupons for medications through different pharmacies. The prices here do not account for what the cost may be with help from  insurance (it may be cheaper with your insurance), but the website can give you the price if you did not use any insurance.  - You can print the associated coupon and take it with your prescription to the pharmacy.  - You may also stop by our office during regular business hours and pick up a GoodRx coupon card.  - If you need your prescription sent electronically to a different pharmacy, notify our office through [facility] MyChart or by phone at [phone] option 4.     Si Usted Necesita Algo Despus de Su Visita  Tambin puede enviarnos un mensaje a travs de MyChart. Por lo general respondemos a los mensajes de MyChart en el transcurso de 1 a 2 das hbiles.  Para renovar recetas, por favor pida a su farmacia que se ponga en contacto con nuestra oficina. Nuestro nmero de fax es el [phone].  Si tiene un asunto urgente cuando la clnica est cerrada y que no puede esperar hasta el siguiente da hbil, puede llamar/localizar a su doctor(a) al nmero que aparece a continuacin.   Por favor, tenga en cuenta que aunque hacemos todo lo posible para estar disponibles para asuntos urgentes fuera del horario de oficina, no estamos disponibles las 24 horas del da, los 7 das de la semana.   Si tiene un problema urgente y no puede comunicarse con nosotros, puede optar por buscar atencin mdica  en el consultorio de su doctor(a), en una clnica privada, en un centro de atencin urgente o en una sala de emergencias.  Si tiene una emergencia mdica, por favor llame inmediatamente al 911 o vaya a la sala de emergencias.  Nmeros de bper  - Dr. Kowalski: [phone]  - Dra. Moye: [phone]  - Dra. Stewart: [phone]  En caso de inclemencias del tiempo, por favor llame a nuestra lnea principal al [phone] para una actualizacin sobre el estado de cualquier retraso o cierre.  Consejos para la medicacin en dermatologa: Por favor, guarde las cajas en las que vienen los  medicamentos de uso tpico para ayudarle a seguir las instrucciones sobre dnde y cmo usarlos. Las farmacias generalmente imprimen las instrucciones del medicamento slo en las cajas y no directamente en los tubos del medicamento.   Si su medicamento   es muy caro, por favor, pngase en contacto con nuestra oficina llamando al [phone] y presione la opcin 4 o envenos un mensaje a travs de MyChart.   No podemos decirle cul ser su copago por los medicamentos por adelantado ya que esto es diferente dependiendo de la cobertura de su seguro. Sin embargo, es posible que podamos encontrar un medicamento sustituto a menor costo o llenar un formulario para que el seguro cubra el medicamento que se considera necesario.   Si se requiere una autorizacin previa para que su compaa de seguros cubra su medicamento, por favor permtanos de 1 a 2 das hbiles para completar este proceso.  Los precios de los medicamentos varan con frecuencia dependiendo del lugar de dnde se surte la receta y alguna farmacias pueden ofrecer precios ms baratos.  El sitio web www.goodrx.com tiene cupones para medicamentos de diferentes farmacias. Los precios aqu no tienen en cuenta lo que podra costar con la ayuda del seguro (puede ser ms barato con su seguro), pero el sitio web puede darle el precio si no utiliz ningn seguro.  - Puede imprimir el cupn correspondiente y llevarlo con su receta a la farmacia.  - Tambin puede pasar por nuestra oficina durante el horario de atencin regular y recoger una tarjeta de cupones de GoodRx.  - Si necesita que su receta se enve electrnicamente a una farmacia diferente, informe a nuestra oficina a travs de MyChart de [facility] o por telfono llamando al [phone] y presione la opcin 4.  

## 2022-12-15 NOTE — Progress Notes (Signed)
Follow-Up Visit   Subjective  Madison Morrow is a 27 y.o. female who presents for the following: Skin Cancer Screening and Full Body Skin Exam Patient has history of bcc and dysplastic moles. Reports a spot at right forearm she would like rechecked.  The patient presents for Total-Body Skin Exam (TBSE) for skin cancer screening and mole check. The patient has spots, moles and lesions to be evaluated, some may be new or changing and the patient has concerns that these could be cancer.  The following portions of the chart were reviewed this encounter and updated as appropriate: medications, allergies, medical history  Review of Systems:  No other skin or systemic complaints except as noted in HPI or Assessment and Plan.  Objective  Well appearing patient in no apparent distress; mood and affect are within normal limits.  A full examination was performed including scalp, head, eyes, ears, nose, lips, neck, chest, axillae, abdomen, back, buttocks, bilateral upper extremities, bilateral lower extremities, hands, feet, fingers, toes, fingernails, and toenails. All findings within normal limits unless otherwise noted below.   Relevant physical exam findings are noted in the Assessment and Plan.  right proximal lateral volar forearm 0.7 cm slightly irregular border brown macule         Assessment & Plan   LENTIGINES, SEBORRHEIC KERATOSES, HEMANGIOMAS - Benign normal skin lesions - Benign-appearing - Call for any changes  MELANOCYTIC NEVI - Tan-brown and/or pink-flesh-colored symmetric macules and papules - Benign appearing on exam today - Observation - Call clinic for new or changing moles - Recommend daily use of broad spectrum spf 30+ sunscreen to sun-exposed areas.   Epidermal inclusion cyst rectum rim Benign-appearing. Exam most consistent with an epidermal inclusion cyst. Discussed that a cyst is a benign growth that can grow over time and sometimes get irritated or  inflamed. Recommend observation if it is not bothersome. Discussed option of surgical excision to remove it if it is growing, symptomatic, or other changes noted. Please call for new or changing lesions so they can be evaluated.  ACTINIC DAMAGE - Chronic condition, secondary to cumulative UV/sun exposure - diffuse scaly erythematous macules with underlying dyspigmentation - Recommend daily broad spectrum sunscreen SPF 30+ to sun-exposed areas, reapply every 2 hours as needed.  - Staying in the shade or wearing long sleeves, sun glasses (UVA+UVB protection) and wide brim hats (4-inch brim around the entire circumference of the hat) are also recommended for sun protection.  - Call for new or changing lesions.  HISTORY OF DYSPLASTIC NEVUS Right temple hairline 9/21 Left posterior scapula left shoulder 9/21 Left posterior deltoid 9/21 Left distal lateral deltoid 9/21 All done at [facility]  03/17/22 : right mid side, mild  06/14/21 : left forearm mild  No evidence of recurrence today Recommend regular full body skin exams Recommend daily broad spectrum sunscreen SPF 30+ to sun-exposed areas, reapply every 2 hours as needed.  Call if any new or changing lesions are noted between office visits  History of melanocytic nevus done at [facility] Re pigmented but ok at left back medial to mid scapula  Bx proven benign nevus 12/24/2018  Re pigmented but ok at left posterior deltoid medial 06/15/2019  No evidence of recurrence today Recommend regular full body skin exams Recommend daily broad spectrum sunscreen SPF 30+ to sun-exposed areas, reapply every 2 hours as needed.  Call if any new or changing lesions are noted between office visits  HISTORY OF BASAL CELL CARCINOMA OF THE SKIN Treated in  [place] - right arm , left chest  - No evidence of recurrence today - Recommend regular full body skin exams - Recommend daily broad spectrum sunscreen SPF 30+ to sun-exposed areas, reapply every 2 hours as  needed.  - Call if any new or changing lesions are noted between office visits  SKIN CANCER SCREENING PERFORMED TODAY.  Neoplasm of uncertain behavior right proximal lateral volar forearm  Epidermal / dermal shaving  Lesion diameter (cm):  0.7 Informed consent: discussed and consent obtained   Timeout: patient name, date of birth, surgical site, and procedure verified   Procedure prep:  Patient was prepped and draped in usual sterile fashion Prep type:  Isopropyl alcohol Anesthesia: the lesion was anesthetized in a standard fashion   Anesthetic:  1% lidocaine w/ epinephrine 1-100,000 buffered w/ 8.4% NaHCO3 Instrument used: flexible razor blade   Hemostasis achieved with: pressure, aluminum chloride and electrodesiccation   Outcome: patient tolerated procedure well   Post-procedure details: sterile dressing applied and wound care instructions given   Dressing type: bandage and petrolatum    Anatomic Pathology Report  Irregular nevus r/o dysplasia   Patient is [employer] employee   2 pieces specimen sent to [employer] for pathology. Patient advised could take 2 weeks for results  Neoplasm of uncertain behavior of skin  Related Medications mupirocin ointment (BACTROBAN) 2 % Apply to aa's wound QD until healed.  Actinic skin damage  Lentigo  Melanocytic nevus, unspecified location  Cyst of skin  History of dysplastic nevus  Skin cancer screening  History of basal cell carcinoma   Return in about 6 months (around 06/17/2023) for TBSE.  IAsher Muir, CMA, am acting as scribe for Armida Sans, MD.  Documentation: I have reviewed the above documentation for accuracy and completeness, and I agree with the above.  Armida Sans, MD

## 2022-12-21 ENCOUNTER — Telehealth: Payer: Self-pay

## 2022-12-21 LAB — ANATOMIC PATHOLOGY REPORT

## 2022-12-21 NOTE — Telephone Encounter (Signed)
Left message for patient to call office for results/hd 

## 2022-12-21 NOTE — Telephone Encounter (Signed)
-----   Message from Deirdre Evener, MD sent at 12/21/2022  5:09 PM EDT ----- Diagnosis synopsis: Comment Comment: Specimen 1-Skin Biopsy, Right Proximal Lateral Volar Forearm: SUPERFICIAL PORTION OF LENTIGINOUS COMPOUND MELANOCYTIC NEVUS.  Benign mole No further treatment needed

## 2022-12-22 ENCOUNTER — Telehealth: Payer: Self-pay

## 2022-12-22 NOTE — Telephone Encounter (Signed)
-----   Message from David C Kowalski, MD sent at 12/21/2022  5:09 PM EDT ----- Diagnosis synopsis: Comment Comment: Specimen 1-Skin Biopsy, Right Proximal Lateral Volar Forearm: SUPERFICIAL PORTION OF LENTIGINOUS COMPOUND MELANOCYTIC NEVUS.  Benign mole No further treatment needed 

## 2022-12-22 NOTE — Telephone Encounter (Signed)
Advised patient of results/hd  

## 2022-12-28 ENCOUNTER — Encounter: Payer: Self-pay | Admitting: Dermatology

## 2023-01-15 IMAGING — CR DG SHOULDER 2+V*L*
1 series · 4 of 4 positions shown · non-contrast
Comparison: None

CLINICAL DATA: Pain radiating to LEFT arm.

EXAM:
LEFT SHOULDER - 2+ VIEW

[Series 1: dg shoulder left · 0.14mm/px · 4 of 4 slices shown]
[im 1/4]
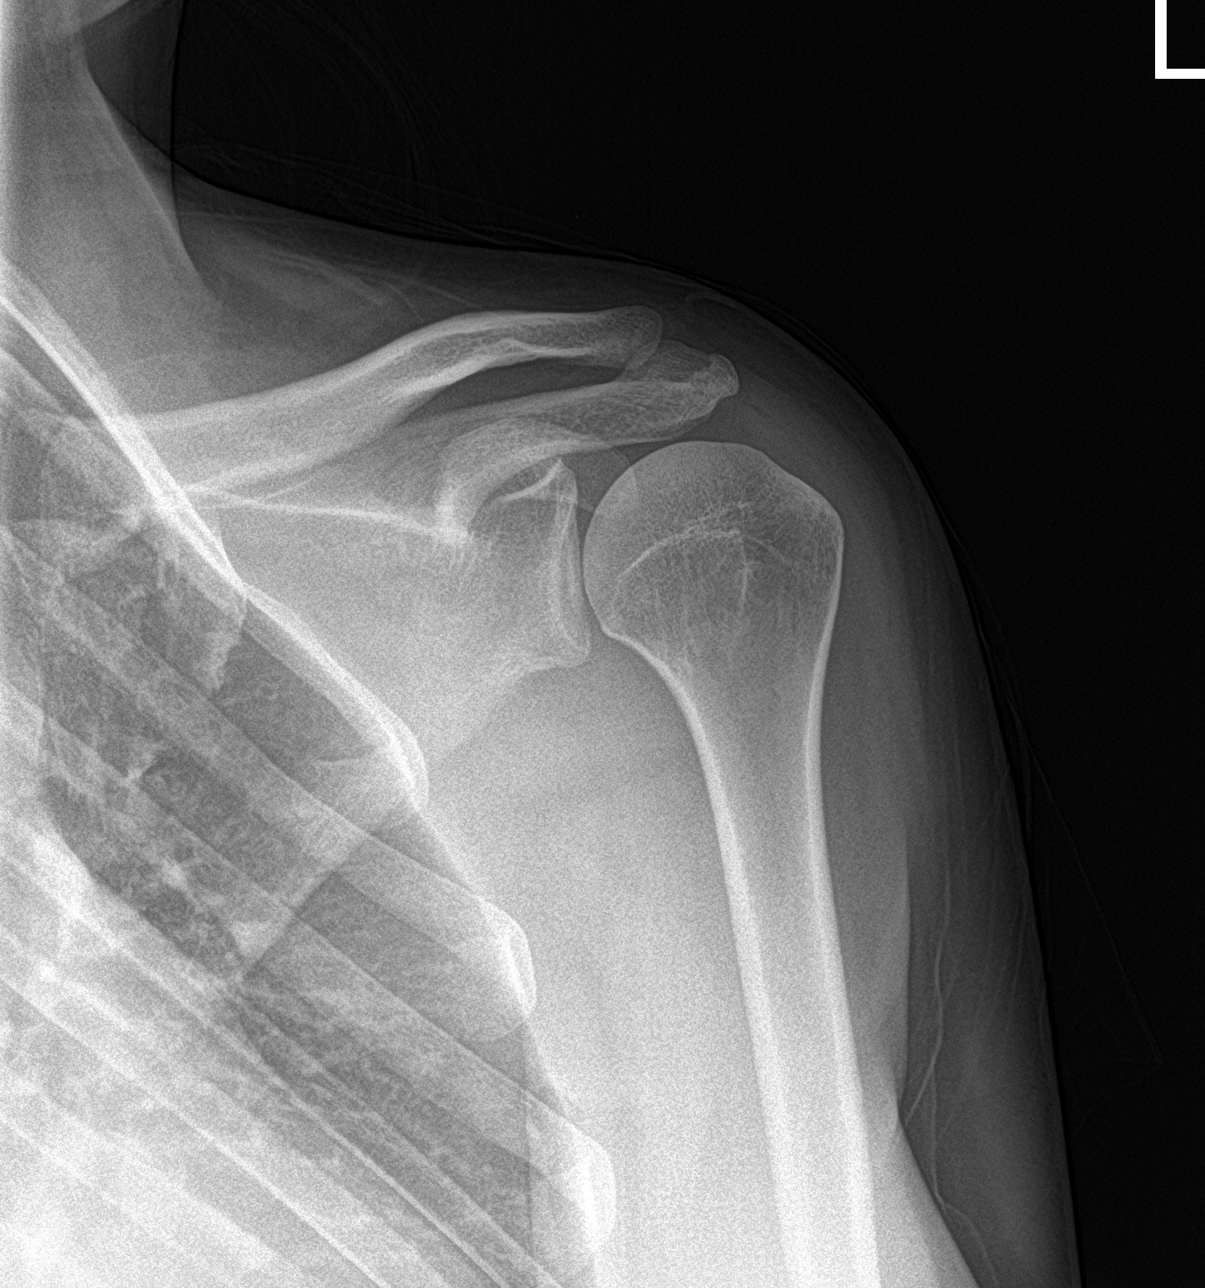
[im 2/4]
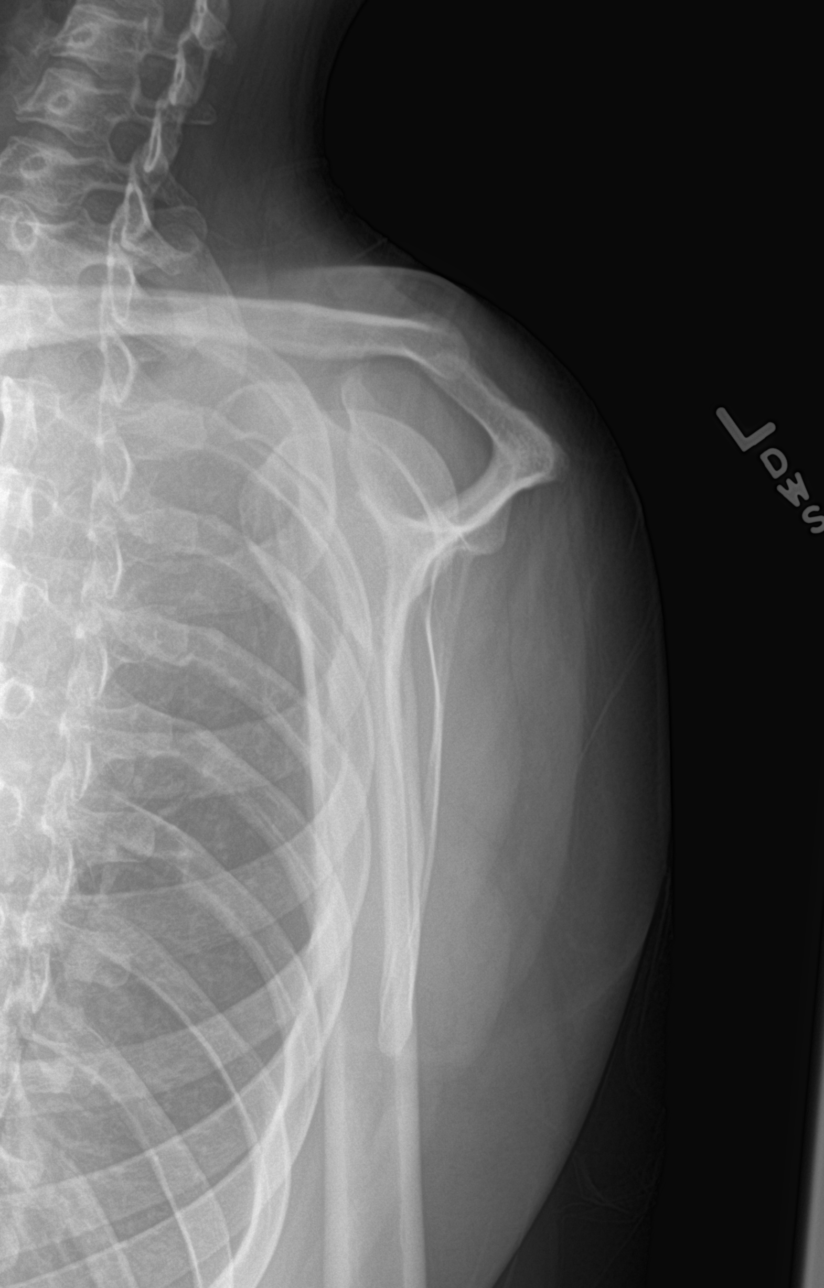
[im 3/4]
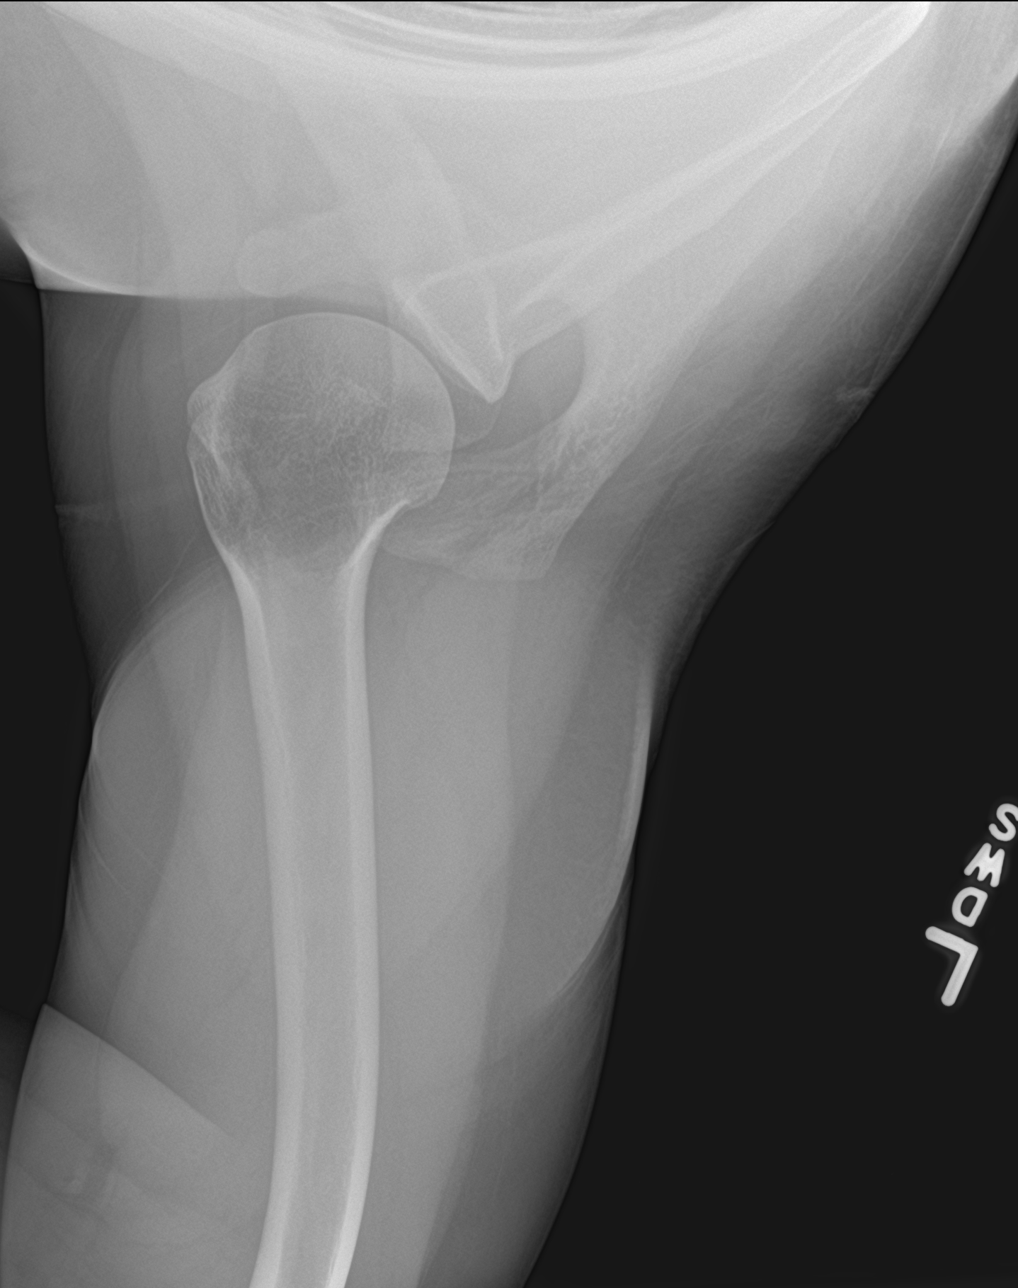
[im 4/4]
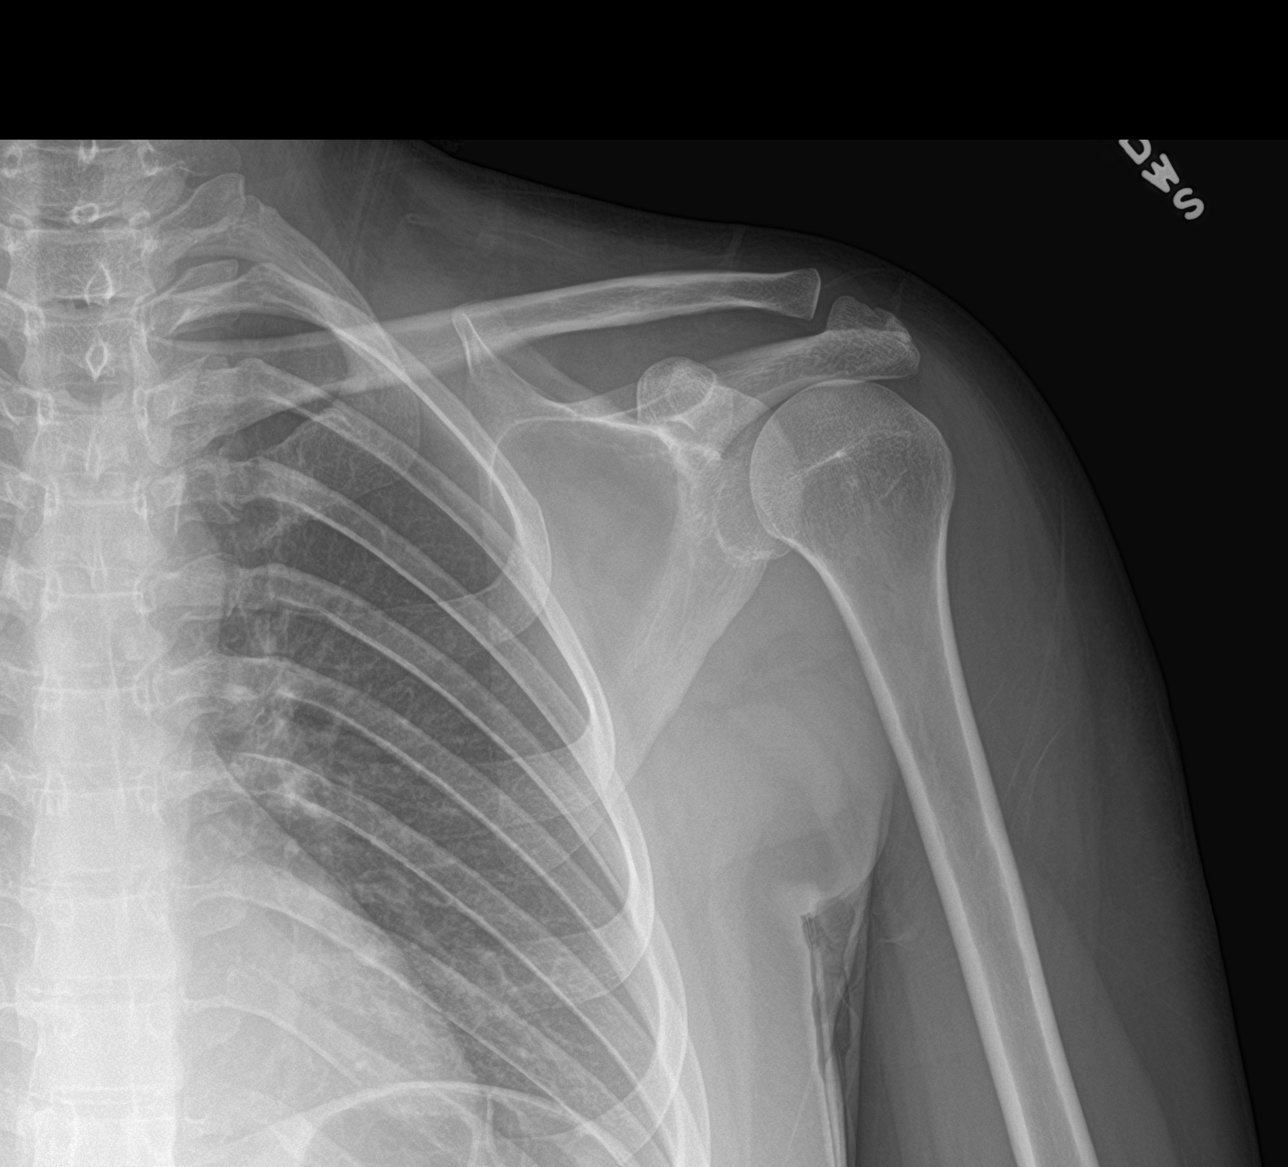

[4 of 4 positions shown; findings below may reference images not displayed]

FINDINGS: There is no evidence of fracture or dislocation. There is no
evidence of arthropathy or other focal bone abnormality. Soft
tissues are unremarkable.
IMPRESSION: Negative evaluation of the LEFT shoulder.

## 2023-06-15 ENCOUNTER — Ambulatory Visit: Payer: Managed Care, Other (non HMO) | Admitting: Dermatology

## 2023-09-21 ENCOUNTER — Ambulatory Visit: Payer: Managed Care, Other (non HMO) | Admitting: Dermatology

## 2023-09-21 DIAGNOSIS — Z85828 Personal history of other malignant neoplasm of skin: Secondary | ICD-10-CM

## 2023-09-21 DIAGNOSIS — Z7189 Other specified counseling: Secondary | ICD-10-CM

## 2023-09-21 DIAGNOSIS — Z79899 Other long term (current) drug therapy: Secondary | ICD-10-CM

## 2023-09-21 DIAGNOSIS — L578 Other skin changes due to chronic exposure to nonionizing radiation: Secondary | ICD-10-CM

## 2023-09-21 DIAGNOSIS — Z1283 Encounter for screening for malignant neoplasm of skin: Secondary | ICD-10-CM | POA: Diagnosis not present

## 2023-09-21 DIAGNOSIS — L821 Other seborrheic keratosis: Secondary | ICD-10-CM

## 2023-09-21 DIAGNOSIS — D1801 Hemangioma of skin and subcutaneous tissue: Secondary | ICD-10-CM

## 2023-09-21 DIAGNOSIS — Z86018 Personal history of other benign neoplasm: Secondary | ICD-10-CM

## 2023-09-21 DIAGNOSIS — W908XXA Exposure to other nonionizing radiation, initial encounter: Secondary | ICD-10-CM

## 2023-09-21 DIAGNOSIS — L814 Other melanin hyperpigmentation: Secondary | ICD-10-CM

## 2023-09-21 DIAGNOSIS — D492 Neoplasm of unspecified behavior of bone, soft tissue, and skin: Secondary | ICD-10-CM

## 2023-09-21 DIAGNOSIS — L719 Rosacea, unspecified: Secondary | ICD-10-CM

## 2023-09-21 DIAGNOSIS — Z86007 Personal history of in-situ neoplasm of skin: Secondary | ICD-10-CM

## 2023-09-21 DIAGNOSIS — D485 Neoplasm of uncertain behavior of skin: Secondary | ICD-10-CM

## 2023-09-21 DIAGNOSIS — D229 Melanocytic nevi, unspecified: Secondary | ICD-10-CM

## 2023-09-21 DIAGNOSIS — Z8589 Personal history of malignant neoplasm of other organs and systems: Secondary | ICD-10-CM

## 2023-09-21 DIAGNOSIS — L209 Atopic dermatitis, unspecified: Secondary | ICD-10-CM

## 2023-09-21 DIAGNOSIS — L905 Scar conditions and fibrosis of skin: Secondary | ICD-10-CM

## 2023-09-21 MED ORDER — SAFETY SEAL MISCELLANEOUS MISC: 5 refills | Status: AC

## 2023-09-21 MED ORDER — MOMETASONE FUROATE 0.1 % EX CREA
TOPICAL_CREAM | CUTANEOUS | 1 refills | Status: AC
Start: 2023-09-21 — End: ?

## 2023-09-21 MED ORDER — MUPIROCIN 2 % EX OINT
TOPICAL_OINTMENT | CUTANEOUS | 1 refills | Status: AC
Start: 2023-09-21 — End: ?

## 2023-09-21 NOTE — Progress Notes (Signed)
 Follow-Up Visit   Subjective  Madison Morrow is a 28 y.o. female who presents for the following: Skin Cancer Screening and Full Body Skin Exam  The patient presents for Total-Body Skin Exam (TBSE) for skin cancer screening and mole check. The patient has spots, moles and lesions to be evaluated, some may be new or changing and the patient may have concern these could be cancer.  Hx SCCis, BCC, DN. Patient works for [employer] if anything needs to be biopsied.   The following portions of the chart were reviewed this encounter and updated as appropriate: medications, allergies, medical history  Review of Systems:  No other skin or systemic complaints except as noted in HPI or Assessment and Plan.  Objective  Well appearing patient in no apparent distress; mood and affect are within normal limits.  A full examination was performed including scalp, head, eyes, ears, nose, lips, neck, chest, axillae, abdomen, back, buttocks, bilateral upper extremities, bilateral lower extremities, hands, feet, fingers, toes, fingernails, and toenails. All findings within normal limits unless otherwise noted below.   Relevant physical exam findings are noted in the Assessment and Plan.    Assessment & Plan   SKIN CANCER SCREENING PERFORMED TODAY.  ACTINIC DAMAGE - Chronic condition, secondary to cumulative UV/sun exposure - diffuse scaly erythematous macules with underlying dyspigmentation - Recommend daily broad spectrum sunscreen SPF 30+ to sun-exposed areas, reapply every 2 hours as needed.  - Staying in the shade or wearing long sleeves, sun glasses (UVA+UVB protection) and wide brim hats (4-inch brim around the entire circumference of the hat) are also recommended for sun protection.  - Call for new or changing lesions.  LENTIGINES, SEBORRHEIC KERATOSES, HEMANGIOMAS - Benign normal skin lesions - Benign-appearing - Call for any changes  MELANOCYTIC NEVI - Tan-brown and/or pink-flesh-colored  symmetric macules and papules - Benign appearing on exam today - Observation - Call clinic for new or changing moles - Recommend daily use of broad spectrum spf 30+ sunscreen to sun-exposed areas.   ROSACEA Exam Significant facial erythema with telangiectasias +/- scattered inflammatory papules  Chronic and persistent condition with duration or expected duration over one year. Condition is bothersome/symptomatic for patient. Currently flared.   Rosacea is a chronic progressive skin condition usually affecting the face of adults, causing redness and/or acne bumps. It is treatable but not curable. It sometimes affects the eyes (ocular rosacea) as well. It may respond to topical and/or systemic medication and can flare with stress, sun exposure, alcohol, exercise, topical steroids (including hydrocortisone/cortisone 10) and some foods.  Daily application of broad spectrum spf 30+ sunscreen to face is recommended to reduce flares.   Treatment Plan Start [facility] Rosacea Extra cr with azelaic acid to qam.   Patient with hx of acne and has done 2 courses of accutane.   Counseling for BBL / IPL / Laser and Coordination of Care Discussed the treatment option of Broad Band Light (BBL) /Intense Pulsed Light (IPL)/ Laser for skin discoloration, including brown spots and redness.  Typically we recommend at least 1-3 treatment sessions about 5-8 weeks apart for best results.  Cannot have tanned skin when BBL performed, and regular use of sunscreen/photoprotection is advised after the procedure to help maintain results. The patient's condition may also require "maintenance treatments" in the future.  The fee for BBL / laser treatments is $350 per treatment session for the whole face.  A fee can be quoted for other parts of the body.  Insurance typically does not pay for  BBL/laser treatments and therefore the fee is an out-of-pocket cost. Recommend prophylactic valtrex treatment. Once scheduled for procedure,  will send Rx in prior to patient's appointment.   HISTORY OF BASAL CELL CARCINOMA OF THE SKIN - No evidence of recurrence today - Recommend regular full body skin exams - Recommend daily broad spectrum sunscreen SPF 30+ to sun-exposed areas, reapply every 2 hours as needed.  - Call if any new or changing lesions are noted between office visits  History of Dysplastic Nevi - No evidence of recurrence today - Recommend regular full body skin exams - Recommend daily broad spectrum sunscreen SPF 30+ to sun-exposed areas, reapply every 2 hours as needed.  - Call if any new or changing lesions are noted between office visits  HISTORY OF SQUAMOUS CELL CARCINOMA IN SITU OF THE SKIN - No evidence of recurrence today - Recommend regular full body skin exams - Recommend daily broad spectrum sunscreen SPF 30+ to sun-exposed areas, reapply every 2 hours as needed.  - Call if any new or changing lesions are noted between office visits  SCAR Exam: scar with central repigmentation at left middle medial scapula  Treatment Plan: We have no hx of what was biopsied at this site. Patient will try to get records from previous derm in [place]. If unable to do so, will plan to biopsy.   NEOPLASM OF UNCERTAIN BEHAVIOR OF SKIN   Related Medications mupirocin ointment (BACTROBAN) 2 % Apply to aa's wound QD until healed. ATOPIC DERMATITIS, UNSPECIFIED TYPE   Related Medications mometasone (ELOCON) 0.1 % cream Apply to aa's rash BID PRN flares. Avoid f/g/a. Return in about 6 months (around 03/20/2024) for TBSE, with Dr. Kirtland Bouchard, HxSCCis, HxBCC, HxDN.  Madison Morrow, RMA, am acting as scribe for Armida Sans, MD .   Documentation: I have reviewed the above documentation for accuracy and completeness, and I agree with the above.  Armida Sans, MD

## 2023-09-21 NOTE — Patient Instructions (Signed)

## 2023-09-25 ENCOUNTER — Encounter: Payer: Self-pay | Admitting: Dermatology

## 2023-10-12 ENCOUNTER — Ambulatory Visit: Payer: Managed Care, Other (non HMO) | Admitting: Dermatology

## 2024-03-11 ENCOUNTER — Ambulatory Visit: Admitting: Dermatology

## 2024-04-12 ENCOUNTER — Ambulatory Visit
Admission: EM | Admit: 2024-04-12 | Discharge: 2024-04-12 | Disposition: A | Discharge status: Home / Self Care | Attending: Emergency Medicine | Admitting: Emergency Medicine

## 2024-04-12 DIAGNOSIS — H60501 Unspecified acute noninfective otitis externa, right ear: Secondary | ICD-10-CM

## 2024-04-12 MED ORDER — CIPROFLOXACIN-DEXAMETHASONE 0.3-0.1 % OT SUSP: 4.0000 [drp] | Freq: Two times a day (BID) | OTIC | 0 refills | Status: AC

## 2024-04-12 NOTE — ED Triage Notes (Addendum)
 Patient to Urgent Care with complaints of right sided ear pain/ muffled hearing/ drainage. Denies any known fevers.   Symptoms x3 day   Taking motrin .

## 2024-04-12 NOTE — Discharge Instructions (Signed)
Use the Ciprodex ear drops as directed.  Follow-up with your primary care provider if your symptoms are not improving.

## 2024-04-12 NOTE — ED Provider Notes (Signed)
 [facility]    CSN: [id number] Arrival date & time: 04/12/24  1651      History   Chief Complaint Chief Complaint  Patient presents with   Otalgia    HPI Madison Morrow is a 28 y.o. female.  Patient presents with 3-day history of right ear pain, drainage, muffled hearing.  No fever, left ear pain, sore throat, cough, shortness of breath.  Treatment at home with Tylenol  and ibuprofen .  The history is provided by the patient and medical records.    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   Past Surgical History:  Procedure Laterality Date   BILATERAL SALPINGECTOMY     LAPAROSCOPIC HYSTERECTOMY N/A 10/14/2019   Procedure: HYSTERECTOMY TOTAL LAPAROSCOPIC, ENDOMETRIOSIS EXCISION;  Surgeon: Verdon Keen, MD;  Location: [facility] ORS;  Service: Gynecology;  Laterality: N/A;   WISDOM TOOTH EXTRACTION      OB History   No obstetric history on file.      Home Medications    Prior to Admission medications   Medication Sig Start Date End Date Taking? Authorizing Provider  ciprofloxacin -dexamethasone  (CIPRODEX ) OTIC suspension Place 4 drops into the right ear 2 (two) times daily. 04/12/24  Yes Corlis Burnard DEL, NP  albuterol (VENTOLIN HFA) 108 (90 Base) MCG/ACT inhaler Inhale 2 puffs into the lungs every 6 (six) hours as needed for shortness of breath. 09/17/19   [provider]  ascorbic acid (VITAMIN C)  500 MG tablet Take 500 mg by mouth daily.    [provider]  docusate sodium  (COLACE) 100 MG capsule Take 1 capsule (100 mg total) by mouth 2 (two) times daily. To keep stools soft 10/14/19   Verdon Keen, MD  famciclovir  (FAMVIR ) 500 MG tablet Take 1 tablet (500 mg total) by mouth 3 (three) times daily. 03/14/21   Gasper Devere ORN, PA-C  ferrous sulfate 325 (65 FE) MG tablet Take 325 mg by mouth daily with breakfast.    [provider]  gabapentin  (NEURONTIN ) 300 MG capsule Take 1 capsule (300 mg total) by mouth 3 (three) times daily for 10 days. 03/14/21 03/24/21  Fisher, Devere ORN, PA-C  mometasone  (ELOCON ) 0.1 % cream Apply to aa's rash BID PRN flares. Avoid f/g/a. 09/21/23   Hester Alm BROCKS, MD  mupirocin  ointment (BACTROBAN ) 2 % Apply to aa's wound QD until healed. 09/21/23   Hester Alm BROCKS, MD  predniSONE  (STERAPRED UNI-PAK 21 TAB) 10 MG (21) TBPK tablet Take 6 pills on day one then decrease by 1 pill each day 03/14/21   Gasper Devere ORN, PA-C  Safety Seal Miscellaneous MISC Apply qam 09/21/23   Hester Alm BROCKS, MD  senna (SENOKOT) 8.6 MG TABS tablet Take 1 tablet (8.6 mg total) by mouth daily as needed for mild constipation. 10/14/19   Verdon Keen, MD    Family History History reviewed.  No pertinent family history.  Social History Social History   Tobacco Use   Smoking status: Never   Smokeless tobacco: Never  Vaping Use   Vaping status: Never Used  Substance Use Topics   Alcohol use: Never   Drug use: Never     Allergies   Amoxicillin and Penicillins   Review of Systems Review of Systems  Constitutional:  Negative for chills and fever.  HENT:  Positive for ear discharge and ear pain. Negative for sore throat.   Respiratory:  Negative for cough and shortness of breath.      Physical Exam Triage Vital Signs ED Triage Vitals  Encounter Vitals Group     BP      Girls Systolic BP Percentile      Girls Diastolic BP Percentile      Boys Systolic  BP Percentile      Boys Diastolic BP Percentile      Pulse      Resp      Temp      Temp src      SpO2      Weight      Height      Head Circumference      Peak Flow      Pain Score      Pain Loc      Pain Education      Exclude from Growth Chart    No data found.  Updated Vital Signs BP 137/84   Pulse 76   Temp 97.9 F (36.6 C)   Resp 18   LMP 09/23/2019   SpO2 100%   Visual Acuity Right Eye Distance:   Left Eye Distance:   Bilateral Distance:    Right Eye Near:   Left Eye Near:    Bilateral Near:     Physical Exam Constitutional:      General: She is not in acute distress. HENT:     Right Ear: Drainage, swelling and tenderness present.     Left Ear: Tympanic membrane and ear canal normal.     Ears:     Comments: Unable to visualize right TM due to purulent drainage in canal.    Nose: Nose normal.     Mouth/Throat:     Mouth: Mucous membranes are moist.     Pharynx: Oropharynx is clear.  Cardiovascular:     Rate and Rhythm: Normal rate and regular rhythm.     Heart sounds: Normal heart sounds.  Pulmonary:     Effort: Pulmonary effort is normal. No respiratory distress.     Breath sounds: Normal breath sounds.  Neurological:     Mental Status: She is alert.      UC Treatments / Results  Labs (all labs ordered are listed, but only abnormal results are displayed) Labs Reviewed - No data to display  EKG   Radiology No results found.  Procedures Procedures (including critical care time)  Medications Ordered in UC Medications - No data to display  Initial Impression / Assessment and Plan / UC Course  I have reviewed the triage vital signs and the nursing notes.  Pertinent labs & imaging results that were available during my care of the patient were reviewed by me and considered in my medical decision making (see chart for details).    Right otitis externa.  Treating with Ciprodex  eardrops.  Education provided on otitis externa.  Instructed  patient to follow up with her PCP if her symptoms are not improving.  She agrees to plan of care.   Final Clinical Impressions(s) / UC Diagnoses   Final diagnoses:  Acute otitis externa of right ear, unspecified type     Discharge Instructions      Use the Ciprodex  eardrops as directed.  Follow-up with your primary care provider if your symptoms are not improving.      ED Prescriptions     Medication Sig Dispense Auth. Provider   ciprofloxacin -dexamethasone  (CIPRODEX ) OTIC suspension Place 4 drops into the right ear 2 (two) times daily. 7.5 mL Corlis Burnard DEL, NP      PDMP not reviewed this encounter.   Corlis Burnard DEL, NP 04/12/24 [phone]

## 2024-04-13 ENCOUNTER — Emergency Department
Admission: EM | Admit: 2024-04-13 | Discharge: 2024-04-14 | Disposition: A | Discharge status: Home / Self Care | Attending: Emergency Medicine | Admitting: Emergency Medicine

## 2024-04-13 ENCOUNTER — Other Ambulatory Visit: Payer: Self-pay

## 2024-04-13 DIAGNOSIS — E119 Type 2 diabetes mellitus without complications: Secondary | ICD-10-CM | POA: Diagnosis not present

## 2024-04-13 DIAGNOSIS — Z85828 Personal history of other malignant neoplasm of skin: Secondary | ICD-10-CM | POA: Insufficient documentation

## 2024-04-13 DIAGNOSIS — H6091 Unspecified otitis externa, right ear: Secondary | ICD-10-CM | POA: Diagnosis not present

## 2024-04-13 DIAGNOSIS — J45909 Unspecified asthma, uncomplicated: Secondary | ICD-10-CM | POA: Diagnosis not present

## 2024-04-13 DIAGNOSIS — H60311 Diffuse otitis externa, right ear: Secondary | ICD-10-CM | POA: Diagnosis not present

## 2024-04-13 DIAGNOSIS — H9201 Otalgia, right ear: Secondary | ICD-10-CM | POA: Diagnosis present

## 2024-04-13 DIAGNOSIS — H6691 Otitis media, unspecified, right ear: Secondary | ICD-10-CM | POA: Insufficient documentation

## 2024-04-13 DIAGNOSIS — D72829 Elevated white blood cell count, unspecified: Secondary | ICD-10-CM | POA: Insufficient documentation

## 2024-04-13 LAB — CBC WITH DIFFERENTIAL/PLATELET
Abs Immature Granulocytes: 0.05 K/uL (ref 0.00–0.07)
Basophils Absolute: 0 K/uL (ref 0.0–0.1)
Basophils Relative: 0 %
Eosinophils Absolute: 0.1 K/uL (ref 0.0–0.5)
Eosinophils Relative: 1 %
HCT: 36.3 % (ref 36.0–46.0)
Hemoglobin: 11.9 g/dL — ABNORMAL LOW (ref 12.0–15.0)
Immature Granulocytes: 0 %
Lymphocytes Relative: 22 %
Lymphs Abs: 2.8 K/uL (ref 0.7–4.0)
MCH: 28.3 pg (ref 26.0–34.0)
MCHC: 32.8 g/dL (ref 30.0–36.0)
MCV: 86.2 fL (ref 80.0–100.0)
Monocytes Absolute: 1.2 K/uL — ABNORMAL HIGH (ref 0.1–1.0)
Monocytes Relative: 10 %
Neutro Abs: 8.6 K/uL — ABNORMAL HIGH (ref 1.7–7.7)
Neutrophils Relative %: 67 %
Platelets: 273 K/uL (ref 150–400)
RBC: 4.21 MIL/uL (ref 3.87–5.11)
RDW: 12.4 % (ref 11.5–15.5)
WBC: 12.8 K/uL — ABNORMAL HIGH (ref 4.0–10.5)
nRBC: 0 % (ref 0.0–0.2)

## 2024-04-13 LAB — BASIC METABOLIC PANEL WITH GFR
Anion gap: 8 (ref 5–15)
BUN: 7 mg/dL (ref 6–20)
CO2: 24 mmol/L (ref 22–32)
Calcium: 9 mg/dL (ref 8.9–10.3)
Chloride: 107 mmol/L (ref 98–111)
Creatinine, Ser: 0.79 mg/dL (ref 0.44–1.00)
GFR, Estimated: >60 mL/min (ref >60)
Glucose, Bld: 101 mg/dL — ABNORMAL HIGH (ref 70–99)
Potassium: 3.5 mmol/L (ref 3.5–5.1)
Sodium: 139 mmol/L (ref 135–145)

## 2024-04-13 NOTE — ED Triage Notes (Addendum)
 Pt reports she was seen yesterday at [facility], diagnosed with ear infections, started on (ear drops)  for the same. This evening, having swelling of the right ear, pain behind the ear that extends into the neck and right facial swelling. Says she feels like she cannot open her jaw completely. Last motrin  1930.

## 2024-04-14 ENCOUNTER — Emergency Department

## 2024-04-14 DIAGNOSIS — M799 Soft tissue disorder, unspecified: Secondary | ICD-10-CM | POA: Diagnosis not present

## 2024-04-14 DIAGNOSIS — H9201 Otalgia, right ear: Secondary | ICD-10-CM | POA: Diagnosis not present

## 2024-04-14 DIAGNOSIS — M542 Cervicalgia: Secondary | ICD-10-CM | POA: Diagnosis not present

## 2024-04-14 DIAGNOSIS — R220 Localized swelling, mass and lump, head: Secondary | ICD-10-CM | POA: Diagnosis not present

## 2024-04-14 LAB — RESP PANEL BY RT-PCR (RSV, FLU A&B, COVID)  RVPGX2
Influenza A by PCR: NEGATIVE
Influenza B by PCR: NEGATIVE
Resp Syncytial Virus by PCR: NEGATIVE
SARS Coronavirus 2 by RT PCR: NEGATIVE

## 2024-04-14 LAB — LACTIC ACID, PLASMA: Lactic Acid, Venous: 0.7 mmol/L (ref 0.5–1.9)

## 2024-04-14 MED ORDER — OXYCODONE HCL 5 MG PO TABS: 5.0000 mg | ORAL_TABLET | Freq: Three times a day (TID) | ORAL | 0 refills | Status: AC | PRN

## 2024-04-14 MED ORDER — ONDANSETRON 4 MG PO TBDP: 4.0000 mg | ORAL_TABLET | Freq: Four times a day (QID) | ORAL | 0 refills | Status: AC | PRN

## 2024-04-14 MED ORDER — CEFDINIR 300 MG PO CAPS: 300.0000 mg | ORAL_CAPSULE | Freq: Two times a day (BID) | ORAL | 0 refills | Status: AC

## 2024-04-14 MED ORDER — ONDANSETRON HCL 4 MG/2ML IJ SOLN
4.0000 mg | Freq: Once | INTRAMUSCULAR | Status: AC
  Administered 2024-04-14: 4 mg via INTRAVENOUS
  Filled 2024-04-14: qty 2

## 2024-04-14 MED ORDER — MORPHINE SULFATE (PF) 4 MG/ML IV SOLN
4.0000 mg | Freq: Once | INTRAVENOUS | Status: AC
  Administered 2024-04-14: 4 mg via INTRAVENOUS
  Filled 2024-04-14: qty 1

## 2024-04-14 MED ORDER — IOHEXOL 300 MG/ML  SOLN
75.0000 mL | Freq: Once | INTRAMUSCULAR | Status: AC | PRN
  Administered 2024-04-14: 75 mL via INTRAVENOUS

## 2024-04-14 MED ORDER — SODIUM CHLORIDE 0.9 % IV BOLUS (SEPSIS)
1000.0000 mL | Freq: Once | INTRAVENOUS | Status: AC
  Administered 2024-04-14: 1000 mL via INTRAVENOUS

## 2024-04-14 MED ORDER — SODIUM CHLORIDE 0.9 % IV SOLN
2.0000 g | Freq: Once | INTRAVENOUS | Status: AC
  Administered 2024-04-14: 2 g via INTRAVENOUS
  Filled 2024-04-14: qty 20

## 2024-04-14 MED ORDER — IBUPROFEN 800 MG PO TABS: 800.0000 mg | ORAL_TABLET | Freq: Three times a day (TID) | ORAL | 0 refills | Status: AC | PRN

## 2024-04-14 MED ORDER — KETOROLAC TROMETHAMINE 30 MG/ML IJ SOLN
30.0000 mg | Freq: Once | INTRAMUSCULAR | Status: AC
  Administered 2024-04-14: 30 mg via INTRAVENOUS
  Filled 2024-04-14: qty 1

## 2024-04-14 MED ORDER — OXYCODONE-ACETAMINOPHEN 5-325 MG PO TABS
2.0000 | ORAL_TABLET | Freq: Once | ORAL | Status: AC
  Administered 2024-04-14: 2 via ORAL
  Filled 2024-04-14: qty 2

## 2024-04-14 NOTE — Discharge Instructions (Addendum)
 Please continue your Ciprodex  drops as prescribed.  You may alternate over the counter Tylenol  1000 mg every 6 hours as needed for pain, fever and Ibuprofen  800 mg every 6-8 hours as needed for pain, fever.  Please take Ibuprofen  with food.  Do not take more than 4000 mg of Tylenol  (acetaminophen ) in a 24 hour period.  You are being provided a prescription for opiates (also known as narcotics) for pain control.  Opiates can be addictive and should only be used when absolutely necessary for pain control when other alternatives do not work.  We recommend you only use them for the recommended amount of time and only as prescribed.  Please do not take with other sedative medications or alcohol.  Please do not drive, operate machinery, make important decisions while taking opiates.  Please note that these medications can be addictive and have high abuse potential.  Patients can become addicted to narcotics after only taking them for a few days.  Please keep these medications locked away from children, teenagers or any family members with history of substance abuse.  Narcotic pain medicine may also make you constipated.  You may use over-the-counter medications such as MiraLAX, Colace to prevent constipation.  If you become constipated, you may use over-the-counter enemas as needed.  Itching and nausea are also common side effects of narcotic pain medication.  If you develop uncontrolled vomiting or a rash, please stop these medications and seek medical care.

## 2024-04-14 NOTE — ED Provider Notes (Signed)
 [facility] Provider Note    Event Date/Time   First MD Initiated Contact with Patient 04/13/24 2302     (approximate)   History   Otalgia   HPI  Madison Morrow is a 28 y.o. female with history of asthma, allergies, squamous cell carcinoma, basal cell carcinoma who presents to the emergency department complaints of severe right ear pain.  States she was recently seen at urgent care and diagnosed with acute otitis externa and started on Ciprodex .  Reports she has had some drainage from her ear.  No fevers, cough, sore throat.  She states that the pain is now extending in front and behind the ear and she is having a hard time opening her jaw due to pain.   History provided by patient.    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 Authorizing Provider  albuterol (VENTOLIN HFA) 108 (90 Base) MCG/ACT inhaler Inhale 2 puffs into the lungs every 6 (six) hours as needed for shortness of breath. 09/17/19   [provider]  ascorbic acid (VITAMIN C) 500 MG  tablet Take 500 mg by mouth daily.    [provider]  ciprofloxacin -dexamethasone  (CIPRODEX ) OTIC suspension Place 4 drops into the right ear 2 (two) times daily. 04/12/24   Corlis Burnard DEL, NP  docusate sodium  (COLACE) 100 MG capsule Take 1 capsule (100 mg total) by mouth 2 (two) times daily. To keep stools soft 10/14/19   Verdon Keen, MD  famciclovir  (FAMVIR ) 500 MG tablet Take 1 tablet (500 mg total) by mouth 3 (three) times daily. 03/14/21   Fisher, Devere ORN, PA-C  ferrous sulfate 325 (65 FE) MG tablet Take 325 mg by mouth daily with breakfast.    [provider]  gabapentin  (NEURONTIN ) 300 MG capsule Take 1 capsule (300 mg total) by mouth 3 (three) times daily for 10 days. 03/14/21 03/24/21  Fisher, Devere ORN, PA-C  mometasone  (ELOCON ) 0.1 % cream Apply to aa's rash BID PRN flares. Avoid f/g/a. 09/21/23   Hester Alm BROCKS, MD  mupirocin  ointment (BACTROBAN ) 2 % Apply to aa's wound QD until healed. 09/21/23   Hester Alm BROCKS, MD  predniSONE  (STERAPRED UNI-PAK 21 TAB) 10 MG (21) TBPK tablet Take 6 pills on day one then decrease by 1 pill each day 03/14/21   Gasper Devere ORN, PA-C  Safety Seal Miscellaneous MISC Apply qam 09/21/23   Hester Alm BROCKS, MD  senna (SENOKOT) 8.6 MG TABS tablet Take 1 tablet (8.6 mg total) by mouth daily  as needed for mild constipation. 10/14/19   Verdon Keen, MD    Physical Exam   Triage Vital Signs: ED Triage Vitals  Encounter Vitals Group     BP 04/13/24 2131 (!) 154/96     Girls Systolic BP Percentile --      Girls Diastolic BP Percentile --      Boys Systolic BP Percentile --      Boys Diastolic BP Percentile --      Pulse Rate 04/13/24 2131 85     Resp 04/13/24 2131 16     Temp 04/13/24 2131 98.9 F (37.2 C)     Temp Source 04/13/24 2131 Oral     SpO2 04/13/24 2131 100 %     Weight 04/13/24 2130 190 lb (86.2 kg)     Height 04/13/24 2130 5' 6 (1.676 m)     Head Circumference --      Peak Flow --      Pain Score 04/13/24 2129 8      Pain Loc --      Pain Education --      Exclude from Growth Chart --     Most recent vital signs: Vitals:   04/14/24 0230 04/14/24 0300  BP: 119/73 113/63  Pulse: 71 73  Resp:  18  Temp:    SpO2: 100% 100%    CONSTITUTIONAL: Alert, responds appropriately to questions. Well-appearing; well-nourished HEAD: Normocephalic, atraumatic EYES: Conjunctivae clear, pupils appear equal, sclera nonicteric ENT: normal nose; moist mucous membranes, left TM and external auditory canal appears normal.  Right TM is obscured from an inflamed right external auditory canal without cerumen impaction.  There is small amount of yellow appearing drainage in the external auditory canal.  No blood.  She is tender over the preauricular area, the helix and pinna previously as well as the process on the right.  There is no increased warmth, redness noted.  No fluctuance or induration.  No signs of facial cellulitis.  Patient does have some pain with opening her mouth but does not have trismus.  There is no sign of dental caries, dental abscess or Ludwig's angina.  No sialoadenitis, parotitis. NECK: Supple, normal ROM, no cervical lymphadenopathy, no thyromegaly, no swelling in the submandibular space CARD: RRR; S1 and S2 appreciated RESP: Normal chest excursion without splinting or tachypnea; breath sounds clear and equal bilaterally; no wheezes, no rhonchi, no rales, no hypoxia or respiratory distress, speaking full sentences ABD/GI: Non-distended; soft, non-tender, no rebound, no guarding, no peritoneal signs BACK: The back appears normal EXT: Normal ROM in all joints; no deformity noted, no edema SKIN: Normal color for age and race; warm; no rash on exposed skin NEURO: Moves all extremities equally, normal speech PSYCH: The patient's mood and manner are appropriate.   ED Results / Procedures / Treatments   LABS: (all labs ordered are listed, but only abnormal results are displayed) Labs Reviewed  BASIC  METABOLIC PANEL WITH GFR - Abnormal; Notable for the following components:      Result Value   Glucose, Bld 101 (*)    All other components within normal limits  CBC WITH DIFFERENTIAL/PLATELET - Abnormal; Notable for the following components:   WBC 12.8 (*)    Hemoglobin 11.9 (*)    Neutro Abs 8.6 (*)    Monocytes Absolute 1.2 (*)    All other components within normal limits  CULTURE, BLOOD (SINGLE)  RESP PANEL BY RT-PCR (RSV, FLU A&B, COVID)  [id number]  LACTIC ACID, PLASMA  EKG:  EKG Interpretation Date/Time:    Ventricular Rate:    PR Interval:    QRS Duration:    QT Interval:    QTC Calculation:   R Axis:      Text Interpretation:           RADIOLOGY: My personal review and interpretation of imaging: CT scan shows otitis externa, otitis media.  No mastoiditis.  No facial cellulitis.  I have personally reviewed all radiology reports.   CT Maxillofacial W Contrast Result Date: 04/14/2024 EXAM: CT Face with contrast 04/14/2024 01:02:22 AM TECHNIQUE: CT of the face was performed with the administration of intravenous contrast. Multiplanar reformatted images are provided for review. Automated exposure control, iterative reconstruction, and/or weight based adjustment of the mA/kV was utilized to reduce the radiation dose to as low as reasonably achievable. COMPARISON: None available CLINICAL HISTORY: EVAL RIGHT MASTOIDITIS, ABSCESS. Pt reports she was seen yesterday at [facility], diagnosed with ear infections, started on (ear drops) for the same. This evening, having swelling of the right ear, pain behind the ear that extends into the neck and right facial swelling. Says she feels like she cannot open her jaw completely. Last motrin  1930. FINDINGS: AERODIGESTIVE TRACT: No mass. No edema. SALIVARY GLANDS: No acute abnormality. LYMPH NODES: No suspicious cervical lymphadenopathy. SOFT TISSUES: There is soft tissue thickening of the right external auditory canal with mild subcutaneous  inflammatory stranding extending into the postauricular tissues. BRAIN, ORBITS AND SINUSES: No acute abnormality. BONES: No acute abnormality. No suspicious bone lesion. No mastoid effusion. MIDDLE EAR: There is a right middle ear effusion. IMPRESSION: 1. Right middle ear effusion and soft tissue thickening of the right external auditory canal with mild subcutaneous inflammatory stranding extending into the postauricular tissues. Consistent with otitis externa and otitis media. 2. No mastoid effusion or infratemporal abscess. Electronically signed by: Franky Stanford MD 04/14/2024 01:27 AM EDT RP Workstation: [id number]     PROCEDURES:  Critical Care performed: No   CRITICAL CARE Performed by: Josette Sink   Total critical care time: 0 minutes  Critical care time was exclusive of separately billable procedures and treating other patients.  Critical care was necessary to treat or prevent imminent or life-threatening deterioration.  Critical care was time spent personally by me on the following activities: development of treatment plan with patient and/or surrogate as well as nursing, discussions with consultants, evaluation of patient's response to treatment, examination of patient, obtaining history from patient or surrogate, ordering and performing treatments and interventions, ordering and review of laboratory studies, ordering and review of radiographic studies, pulse oximetry and re-evaluation of patient's condition.   Procedures    IMPRESSION / MDM / ASSESSMENT AND PLAN / ED COURSE  I reviewed the triage vital signs and the nursing notes.    Patient here with otitis externa with worsening pain and tenderness now over the mastoid process.    DIFFERENTIAL DIAGNOSIS (includes but not limited to):   Otitis externa, otitis media, perichondritis, mastoiditis, less likely facial cellulitis, Ludwig's angina, deep space neck infection, sialoadenitis, parotitis   Patient's presentation  is most consistent with acute presentation with potential threat to life or bodily function.   PLAN: Labs show leukocytosis.  Normal creatinine.  Will check lactic, blood culture.  Will give Rocephin , IV fluids, pain medication.  Will obtain CT of the face after discussion with the radiologist on appropriate imaging to evaluate for abscess, mastoiditis.   MEDICATIONS GIVEN IN ED: Medications  cefTRIAXone  (ROCEPHIN ) 2 g in sodium chloride   0.9 % 100 mL IVPB (0 g Intravenous Stopped 04/14/24 0055)  ketorolac  (TORADOL ) 30 MG/ML injection 30 mg (30 mg Intravenous Given 04/14/24 0021)  morphine  (PF) 4 MG/ML injection 4 mg (4 mg Intravenous Given 04/14/24 0023)  ondansetron  (ZOFRAN ) injection 4 mg (4 mg Intravenous Given 04/14/24 0021)  sodium chloride  0.9 % bolus 1,000 mL (0 mLs Intravenous Stopped 04/14/24 0050)  iohexol  (OMNIPAQUE ) 300 MG/ML solution 75 mL (75 mLs Intravenous Contrast Given 04/14/24 0054)  oxyCODONE -acetaminophen  (PERCOCET/ROXICET) 5-325 MG per tablet 2 tablet (2 tablets Oral Given 04/14/24 0319)     ED COURSE: CT scan reviewed and interpreted by myself and the radiologist and shows no signs of mastoiditis, abscess.  She does have otitis externa and otitis media.  Will discharge with cefdinir  to take twice a day for the next week and have advised her to continue her Ciprodex .  We have placed an ear wick into her ear to help with the administration of her eardrops.  Will discharge with pain medication.  Recommended close follow-up with her primary care doctor in 1 week to ensure resolution but have also provided her with ENT follow-up.  Given she is young, well-appearing, nontoxic, otherwise healthy, history of diabetes, I feel she is safe for discharge home for continued outpatient management.  She is also comfortable this plan.   CONSULTS: Admission considered but patient comfortable with outpatient management.   OUTSIDE RECORDS REVIEWED: Reviewed recent urgent care  notes.       FINAL CLINICAL IMPRESSION(S) / ED DIAGNOSES   Final diagnoses:  Acute diffuse otitis externa of right ear  Acute otitis media, right     Rx / DC Orders   ED Discharge Orders          Ordered    cefdinir  (OMNICEF ) 300 MG capsule  2 times daily        04/14/24 0153    ibuprofen  (ADVIL ) 800 MG tablet  Every 8 hours PRN        04/14/24 0153    oxyCODONE  (ROXICODONE ) 5 MG immediate release tablet  Every 8 hours PRN        04/14/24 0153    ondansetron  (ZOFRAN -ODT) 4 MG disintegrating tablet  Every 6 hours PRN        04/14/24 0153             Note:  This document was prepared using Dragon voice recognition software and may include unintentional dictation errors.   Tatum Corl, Josette SAILOR, DO 04/14/24 1112

## 2024-04-17 ENCOUNTER — Ambulatory Visit: Payer: Managed Care, Other (non HMO) | Admitting: Dermatology

## 2024-04-19 LAB — CULTURE, BLOOD (SINGLE)
Culture: NO GROWTH
Special Requests: ADEQUATE

## 2024-04-23 DIAGNOSIS — Z2821 Immunization not carried out because of patient refusal: Secondary | ICD-10-CM | POA: Diagnosis not present

## 2024-04-23 DIAGNOSIS — Z1331 Encounter for screening for depression: Secondary | ICD-10-CM | POA: Diagnosis not present

## 2024-04-23 DIAGNOSIS — H6692 Otitis media, unspecified, left ear: Secondary | ICD-10-CM | POA: Diagnosis not present

## 2024-04-23 DIAGNOSIS — Z0000 Encounter for general adult medical examination without abnormal findings: Secondary | ICD-10-CM | POA: Diagnosis not present

## 2024-05-07 ENCOUNTER — Ambulatory Visit: Admitting: Dermatology

## 2024-07-16 ENCOUNTER — Ambulatory Visit: Admitting: Dermatology

## 2024-09-12 ENCOUNTER — Ambulatory Visit: Admitting: Dermatology
